# Patient Record
Sex: Female | Born: 1960 | Race: White | Hispanic: No | Marital: Married | Smoking: Current every day smoker
Health system: Southern US, Community
[De-identification: ages and names within clinical notes are randomized; demographics above are authoritative.]

## PROBLEM LIST (undated history)

## (undated) DIAGNOSIS — K30 Functional dyspepsia: Secondary | ICD-10-CM

## (undated) DIAGNOSIS — Z87442 Personal history of urinary calculi: Secondary | ICD-10-CM

## (undated) DIAGNOSIS — E782 Mixed hyperlipidemia: Secondary | ICD-10-CM

## (undated) DIAGNOSIS — R7303 Prediabetes: Secondary | ICD-10-CM

## (undated) DIAGNOSIS — K5790 Diverticulosis of intestine, part unspecified, without perforation or abscess without bleeding: Secondary | ICD-10-CM

## (undated) DIAGNOSIS — N159 Renal tubulo-interstitial disease, unspecified: Secondary | ICD-10-CM

## (undated) DIAGNOSIS — D649 Anemia, unspecified: Secondary | ICD-10-CM

## (undated) DIAGNOSIS — Z86010 Personal history of colonic polyps: Secondary | ICD-10-CM

## (undated) DIAGNOSIS — L409 Psoriasis, unspecified: Secondary | ICD-10-CM

## (undated) HISTORY — PX: OTHER SURGICAL HISTORY: SHX169

## (undated) HISTORY — PX: COLONOSCOPY: SHX174

## (undated) HISTORY — PX: EXTRACORPOREAL SHOCK WAVE LITHOTRIPSY: SHX1557

## (undated) HISTORY — PX: SKIN GRAFT: SHX250

## (undated) HISTORY — PX: DENTAL SURGERY: SHX609

---

## 2018-09-08 ENCOUNTER — Encounter (HOSPITAL_COMMUNITY)
Admission: EM | Disposition: A | Discharge status: Home / Self Care | Payer: Self-pay | Source: Home / Self Care | Attending: Emergency Medicine

## 2018-09-08 ENCOUNTER — Observation Stay (HOSPITAL_COMMUNITY)
Admission: EM | Admit: 2018-09-08 | Discharge: 2018-09-09 | Disposition: A | Discharge status: Home / Self Care | Payer: BLUE CROSS/BLUE SHIELD | Attending: Urology | Admitting: Urology

## 2018-09-08 ENCOUNTER — Other Ambulatory Visit: Payer: Self-pay

## 2018-09-08 ENCOUNTER — Encounter (HOSPITAL_COMMUNITY): Payer: Self-pay

## 2018-09-08 ENCOUNTER — Emergency Department (HOSPITAL_COMMUNITY): Payer: BLUE CROSS/BLUE SHIELD

## 2018-09-08 ENCOUNTER — Emergency Department (HOSPITAL_COMMUNITY): Payer: BLUE CROSS/BLUE SHIELD | Admitting: Anesthesiology

## 2018-09-08 DIAGNOSIS — N200 Calculus of kidney: Secondary | ICD-10-CM

## 2018-09-08 DIAGNOSIS — N12 Tubulo-interstitial nephritis, not specified as acute or chronic: Secondary | ICD-10-CM | POA: Diagnosis present

## 2018-09-08 DIAGNOSIS — N136 Pyonephrosis: Secondary | ICD-10-CM | POA: Diagnosis not present

## 2018-09-08 DIAGNOSIS — N132 Hydronephrosis with renal and ureteral calculous obstruction: Secondary | ICD-10-CM | POA: Diagnosis present

## 2018-09-08 HISTORY — PX: CYSTOSCOPY WITH STENT PLACEMENT: SHX5790

## 2018-09-08 LAB — CBC
HEMATOCRIT: 37.9 % (ref 36.0–46.0)
Hemoglobin: 12.1 g/dL (ref 12.0–15.0)
MCH: 28.5 pg (ref 26.0–34.0)
MCHC: 31.9 g/dL (ref 30.0–36.0)
MCV: 89.4 fL (ref 80.0–100.0)
NRBC: 0 % (ref 0.0–0.2)
Platelets: 617 10*3/uL — ABNORMAL HIGH (ref 150–400)
RBC: 4.24 MIL/uL (ref 3.87–5.11)
RDW: 14.1 % (ref 11.5–15.5)
WBC: 21.2 10*3/uL — AB (ref 4.0–10.5)

## 2018-09-08 LAB — URINALYSIS, ROUTINE W REFLEX MICROSCOPIC
BILIRUBIN URINE: NEGATIVE
Glucose, UA: NEGATIVE mg/dL
Ketones, ur: NEGATIVE mg/dL
Nitrite: POSITIVE — AB
Protein, ur: NEGATIVE mg/dL
SPECIFIC GRAVITY, URINE: 1.01 (ref 1.005–1.030)
WBC, UA: >50 WBC/hpf — ABNORMAL HIGH (ref 0–5)
pH: 6 (ref 5.0–8.0)

## 2018-09-08 LAB — BASIC METABOLIC PANEL
ANION GAP: 10 (ref 5–15)
BUN: 17 mg/dL (ref 6–20)
CALCIUM: 9 mg/dL (ref 8.9–10.3)
CO2: 22 mmol/L (ref 22–32)
Chloride: 103 mmol/L (ref 98–111)
Creatinine, Ser: 1.09 mg/dL — ABNORMAL HIGH (ref 0.44–1.00)
GFR, EST NON AFRICAN AMERICAN: 56 mL/min — AB (ref >60)
Glucose, Bld: 128 mg/dL — ABNORMAL HIGH (ref 70–99)
POTASSIUM: 3.6 mmol/L (ref 3.5–5.1)
Sodium: 135 mmol/L (ref 135–145)

## 2018-09-08 SURGERY — CYSTOSCOPY, WITH STENT INSERTION
Anesthesia: General | Site: Ureter | Laterality: Right

## 2018-09-08 MED ORDER — ZOLPIDEM TARTRATE 5 MG PO TABS: 5.0000 mg | ORAL_TABLET | Freq: Every evening | ORAL | Status: DC | PRN

## 2018-09-08 MED ORDER — ACETAMINOPHEN 325 MG PO TABS
650.0000 mg | ORAL_TABLET | ORAL | Status: DC | PRN
  Administered 2018-09-09 (×2): 650 mg via ORAL
  Filled 2018-09-08 (×2): qty 2

## 2018-09-08 MED ORDER — LIDOCAINE 2% (20 MG/ML) 5 ML SYRINGE
INTRAMUSCULAR | Status: DC | PRN
  Administered 2018-09-08: 60 mg via INTRAVENOUS

## 2018-09-08 MED ORDER — LACTATED RINGERS IV SOLN
Freq: Once | INTRAVENOUS | Status: AC
  Administered 2018-09-08: 16:00:00 via INTRAVENOUS

## 2018-09-08 MED ORDER — KETOROLAC TROMETHAMINE 15 MG/ML IJ SOLN
15.0000 mg | Freq: Once | INTRAMUSCULAR | Status: AC
  Administered 2018-09-08: 15 mg via INTRAVENOUS
  Filled 2018-09-08: qty 1

## 2018-09-08 MED ORDER — KCL IN DEXTROSE-NACL 20-5-0.45 MEQ/L-%-% IV SOLN
INTRAVENOUS | Status: DC
  Administered 2018-09-08 – 2018-09-09 (×3): via INTRAVENOUS
  Filled 2018-09-08 (×4): qty 1000

## 2018-09-08 MED ORDER — MIDAZOLAM HCL 2 MG/2ML IJ SOLN
INTRAMUSCULAR | Status: AC
  Filled 2018-09-08: qty 2

## 2018-09-08 MED ORDER — MIDAZOLAM HCL 5 MG/5ML IJ SOLN
INTRAMUSCULAR | Status: DC | PRN
  Administered 2018-09-08: 2 mg via INTRAVENOUS

## 2018-09-08 MED ORDER — SODIUM CHLORIDE 0.9 % IV SOLN: INTRAVENOUS | Status: DC | PRN

## 2018-09-08 MED ORDER — FENTANYL CITRATE (PF) 100 MCG/2ML IJ SOLN
INTRAMUSCULAR | Status: AC
  Filled 2018-09-08: qty 2

## 2018-09-08 MED ORDER — LACTATED RINGERS IV SOLN
INTRAVENOUS | Status: DC | PRN
  Administered 2018-09-08: 17:00:00 via INTRAVENOUS

## 2018-09-08 MED ORDER — FENTANYL CITRATE (PF) 100 MCG/2ML IJ SOLN: 25.0000 ug | INTRAMUSCULAR | Status: DC | PRN

## 2018-09-08 MED ORDER — FENTANYL CITRATE (PF) 100 MCG/2ML IJ SOLN
INTRAMUSCULAR | Status: DC | PRN
  Administered 2018-09-08 (×2): 50 ug via INTRAVENOUS

## 2018-09-08 MED ORDER — DIPHENHYDRAMINE HCL 12.5 MG/5ML PO ELIX: 12.5000 mg | ORAL_SOLUTION | Freq: Four times a day (QID) | ORAL | Status: DC | PRN

## 2018-09-08 MED ORDER — PROPOFOL 10 MG/ML IV BOLUS
INTRAVENOUS | Status: AC
  Filled 2018-09-08: qty 20

## 2018-09-08 MED ORDER — SODIUM CHLORIDE 0.9 % IR SOLN
Status: DC | PRN
  Administered 2018-09-08: 3000 mL

## 2018-09-08 MED ORDER — SODIUM CHLORIDE 0.9 % IV SOLN
1.0000 g | INTRAVENOUS | Status: DC
  Administered 2018-09-09: 1 g via INTRAVENOUS
  Filled 2018-09-08: qty 1

## 2018-09-08 MED ORDER — ONDANSETRON HCL 4 MG/2ML IJ SOLN: 4.0000 mg | INTRAMUSCULAR | Status: DC | PRN

## 2018-09-08 MED ORDER — PROPOFOL 10 MG/ML IV BOLUS
INTRAVENOUS | Status: DC | PRN
  Administered 2018-09-08: 140 mg via INTRAVENOUS

## 2018-09-08 MED ORDER — HYDROCODONE-ACETAMINOPHEN 5-325 MG PO TABS: 1.0000 | ORAL_TABLET | ORAL | Status: DC | PRN

## 2018-09-08 MED ORDER — SODIUM CHLORIDE 0.9 % IV SOLN
2.0000 g | Freq: Once | INTRAVENOUS | Status: AC
  Administered 2018-09-08: 2 g via INTRAVENOUS
  Filled 2018-09-08: qty 20

## 2018-09-08 MED ORDER — ONDANSETRON HCL 4 MG/2ML IJ SOLN
4.0000 mg | Freq: Once | INTRAMUSCULAR | Status: AC
  Administered 2018-09-08: 4 mg via INTRAVENOUS
  Filled 2018-09-08: qty 2

## 2018-09-08 MED ORDER — ROSUVASTATIN CALCIUM 20 MG PO TABS
20.0000 mg | ORAL_TABLET | Freq: Every day | ORAL | Status: DC
  Administered 2018-09-08: 20 mg via ORAL
  Filled 2018-09-08: qty 1

## 2018-09-08 MED ORDER — DEXAMETHASONE SODIUM PHOSPHATE 10 MG/ML IJ SOLN
INTRAMUSCULAR | Status: DC | PRN
  Administered 2018-09-08: 5 mg via INTRAVENOUS

## 2018-09-08 MED ORDER — PROMETHAZINE HCL 25 MG/ML IJ SOLN: 6.2500 mg | INTRAMUSCULAR | Status: DC | PRN

## 2018-09-08 MED ORDER — ONDANSETRON HCL 4 MG/2ML IJ SOLN
INTRAMUSCULAR | Status: DC | PRN
  Administered 2018-09-08: 4 mg via INTRAVENOUS

## 2018-09-08 MED ORDER — DIPHENHYDRAMINE HCL 50 MG/ML IJ SOLN: 12.5000 mg | Freq: Four times a day (QID) | INTRAMUSCULAR | Status: DC | PRN

## 2018-09-08 MED ORDER — SODIUM CHLORIDE 0.9 % IV BOLUS
500.0000 mL | Freq: Once | INTRAVENOUS | Status: AC
  Administered 2018-09-08: 500 mL via INTRAVENOUS

## 2018-09-08 SURGICAL SUPPLY — 15 items
BAG URO CATCHER STRL LF (MISCELLANEOUS) ×3 IMPLANT
CATH INTERMIT  6FR 70CM (CATHETERS) ×3 IMPLANT
CLOTH BEACON ORANGE TIMEOUT ST (SAFETY) ×3 IMPLANT
CONT SPEC 4OZ CLIKSEAL STRL BL (MISCELLANEOUS) ×3 IMPLANT
COVER WAND RF STERILE (DRAPES) IMPLANT
GLOVE BIOGEL M STRL SZ7.5 (GLOVE) ×3 IMPLANT
GLOVE BIOGEL PI IND STRL 6.5 (GLOVE) ×2 IMPLANT
GLOVE BIOGEL PI INDICATOR 6.5 (GLOVE) ×4
GOWN STRL REUS W/TWL LRG LVL3 (GOWN DISPOSABLE) ×6 IMPLANT
GUIDEWIRE STR DUAL SENSOR (WIRE) ×3 IMPLANT
MANIFOLD NEPTUNE II (INSTRUMENTS) ×3 IMPLANT
PACK CYSTO (CUSTOM PROCEDURE TRAY) ×3 IMPLANT
STENT URET 6FRX24 CONTOUR (STENTS) ×3 IMPLANT
TUBING CONNECTING 10 (TUBING) ×2 IMPLANT
TUBING CONNECTING 10' (TUBING) ×1

## 2018-09-08 NOTE — Op Note (Signed)
Preoperative diagnosis:  1. Right ureteral calculi and pyelonephritis   Postoperative diagnosis:  1. Right ureteral calculi and pyelonephritis   Procedure:  1. Cystoscopy 2. Right ureteral stent placement (6 x 24 - no string)  Surgeon: Rolly Salter, Montez Hageman. M.D.  Anesthesia: General  Complications: None  Intraoperative findings: There was noted to be purulent material from the stent after placement.  A urine culture was sent from the OR.  EBL: Minimal  Specimens: None  Indication: Rita Stewart is a 57 y.o. patient with residual right ureteral calculi after outside ESWL with fever and evidence of right pyelonephritis. After reviewing the management options for treatment, he elected to proceed with the above surgical procedure(s). We have discussed the potential benefits and risks of the procedure, side effects of the proposed treatment, the likelihood of the patient achieving the goals of the procedure, and any potential problems that might occur during the procedure or recuperation. Informed consent has been obtained.  Description of procedure:  The patient was taken to the operating room and general anesthesia was induced.  The patient was placed in the dorsal lithotomy position, prepped and draped in the usual sterile fashion, and preoperative antibiotics were administered. A preoperative time-out was performed.   Cystourethroscopy was performed.  The patient's urethra was examined and was normal. The bladder was then systematically examined in its entirety. There was no evidence for any bladder tumors, stones, or other mucosal pathology.    A 0.38 sensor guidewire was then advanced up the right ureter into the renal pelvis under fluoroscopic guidance.  The wire was then backloaded through the cystoscope and a ureteral stent was advance over the wire using Seldinger technique.  The stent was positioned appropriately under fluoroscopic and cystoscopic guidance.  The wire was then  removed with an adequate stent curl noted in the renal pelvis as well as in the bladder.  Purulent material was seen draining from the stent.  A urine culture was obtained and sent to the lab.  The bladder was then emptied and the procedure ended.  The patient appeared to tolerate the procedure well and without complications.  The patient was able to be awakened and transferred to the recovery unit in satisfactory condition.    Moody Bruins MD

## 2018-09-08 NOTE — Anesthesia Preprocedure Evaluation (Signed)
Anesthesia Evaluation  Patient identified by MRN, date of birth, ID band Patient awake    Reviewed: Allergy & Precautions, NPO status , Patient's Chart, lab work & pertinent test results  Airway Mallampati: II  TM Distance: >3 FB Neck ROM: Full    Dental no notable dental hx.    Pulmonary neg pulmonary ROS,    Pulmonary exam normal breath sounds clear to auscultation       Cardiovascular negative cardio ROS Normal cardiovascular exam Rhythm:Regular Rate:Normal     Neuro/Psych negative neurological ROS  negative psych ROS   GI/Hepatic negative GI ROS, Neg liver ROS,   Endo/Other  negative endocrine ROS  Renal/GU negative Renal ROS  negative genitourinary   Musculoskeletal negative musculoskeletal ROS (+)   Abdominal   Peds negative pediatric ROS (+)  Hematology negative hematology ROS (+)   Anesthesia Other Findings   Reproductive/Obstetrics negative OB ROS                             Anesthesia Physical Anesthesia Plan  ASA: II  Anesthesia Plan: General   Post-op Pain Management:    Induction: Intravenous  PONV Risk Score and Plan: 3 and Ondansetron, Dexamethasone and Treatment may vary due to age or medical condition  Airway Management Planned: LMA  Additional Equipment:   Intra-op Plan:   Post-operative Plan: Extubation in OR  Informed Consent: I have reviewed the patients History and Physical, chart, labs and discussed the procedure including the risks, benefits and alternatives for the proposed anesthesia with the patient or authorized representative who has indicated his/her understanding and acceptance.     Dental advisory given  Plan Discussed with: CRNA and Surgeon  Anesthesia Plan Comments:         Anesthesia Quick Evaluation  

## 2018-09-08 NOTE — Discharge Instructions (Signed)

## 2018-09-08 NOTE — ED Notes (Signed)
Patient aware we need urine sample. Will call out when ready to void.  

## 2018-09-08 NOTE — Anesthesia Procedure Notes (Signed)
Procedure Name: LMA Insertion Performed by: Ericson Nafziger J, CRNA Pre-anesthesia Checklist: Patient identified, Emergency Drugs available, Suction available, Patient being monitored and Timeout performed Patient Re-evaluated:Patient Re-evaluated prior to induction Oxygen Delivery Method: Circle system utilized Preoxygenation: Pre-oxygenation with 100% oxygen Induction Type: IV induction Ventilation: Mask ventilation without difficulty LMA: LMA inserted LMA Size: 4.0 Number of attempts: 1 Placement Confirmation: positive ETCO2 and breath sounds checked- equal and bilateral Tube secured with: Tape Dental Injury: Teeth and Oropharynx as per pre-operative assessment        

## 2018-09-08 NOTE — ED Provider Notes (Addendum)
[facility]-EMERGENCY DEPT Provider Note   CSN: [id number] Arrival date & time: 09/08/18  4098     History   Chief Complaint Chief Complaint  Patient presents with  . Flank Pain    HPI Rita Stewart is a 57 y.o. female with medical history significant for right-sided nephrolithiasis status post stent placement and removal of stent, hypertension, psoriasis, hyperlipidemia, adjustment insomnia presenting for evaluation of an acute onset right flank pain.  She also reports of subjective fevers with range of 99.1 to 100.43F, chills, nausea but however denies dysuria, urinary frequency, urinary urgency or hematuria.  Per chart review, patient was noted to have a 1.9 cm right renal pelvic calculus with mild right hydronephrosis on CT abdomen on 07/15/2018.  She subsequently developed UTI and was placed on IV antibiotics as well as oral Keflex with last dose on 09/02/2018.  She had a right J stent placement for 2 weeks which was removed on 10/17.  Follow-up abdominal ultrasound revealed no evidence of renal calculus.   Past Medical History:  Diagnosis Date  . Renal disorder     There are no active problems to display for this patient.   Past Surgical History:  Procedure Laterality Date  . DENTAL SURGERY    . EXTRACORPOREAL SHOCK WAVE LITHOTRIPSY    . SKIN GRAFT    . Stent in kidney       OB History   None      Home Medications    Prior to Admission medications   Medication Sig Start Date End Date Taking? Authorizing Provider  Choline Fenofibrate (FENOFIBRIC ACID) 135 MG CPDR Take 135 mg by mouth at bedtime.  08/16/18  Yes [provider]  ibuprofen (ADVIL,MOTRIN) 200 MG tablet Take 400-600 mg by mouth every 6 (six) hours as needed for mild pain.   Yes [provider]  ketorolac (TORADOL) 10 MG tablet Take 10 mg by mouth every 6 (six) hours as needed for moderate pain.  08/27/18  Yes [provider]  ondansetron (ZOFRAN) 4 MG  tablet Take 4 mg by mouth every 8 (eight) hours as needed for nausea or vomiting.   Yes [provider]  rosuvastatin (CRESTOR) 20 MG tablet Take 20 mg by mouth at bedtime. 08/19/18  Yes [provider]  traMADol (ULTRAM) 50 MG tablet Take 50 mg by mouth every 6 (six) hours as needed for moderate pain.  07/16/18  Yes [provider]  oxybutynin (DITROPAN) 5 MG tablet Take 5 mg by mouth 3 (three) times daily as needed for bladder spasms.  08/27/18   [provider]    Family History No family history on file.  Social History Social History   Tobacco Use  . Smoking status: Not on file  Substance Use Topics  . Alcohol use: Not on file  . Drug use: Not on file     Allergies   Ciprofloxacin and Nitrofurantoin   Review of Systems Review of Systems  Constitutional: Positive for chills and fever.  Respiratory: Negative.   Cardiovascular: Negative.   Gastrointestinal: Positive for nausea. Negative for vomiting.  Genitourinary: Positive for flank pain. Negative for difficulty urinating, dysuria, frequency and urgency.  Skin: Negative.   Neurological: Negative.   Psychiatric/Behavioral: Negative.      Physical Exam Updated Vital Signs BP 135/84   Pulse (!) 106   Temp 98.4 F (36.9 C) (Oral)   Resp 15   Ht 5\' 7"  (1.702 m)   Wt 73.5 kg  SpO2 95%   BMI 25.37 kg/m   Physical Exam  Constitutional: She appears well-developed and well-nourished.  HENT:  Head: Normocephalic and atraumatic.  Cardiovascular: Tachycardia present.  Pulmonary/Chest: Effort normal and breath sounds normal.  Abdominal: Soft. Bowel sounds are normal. There is CVA tenderness (Mild ). There is no rigidity, no rebound, no guarding, no tenderness at McBurney's point and negative Murphy's sign.  Neurological: She is alert.  Skin: Skin is warm.  Psychiatric: She has a normal mood and affect. Her behavior is normal. Judgment and thought content normal.     ED Treatments  / Results  Labs (all labs ordered are listed, but only abnormal results are displayed) Labs Reviewed  URINALYSIS, ROUTINE W REFLEX MICROSCOPIC - Abnormal; Notable for the following components:      Result Value   Hgb urine dipstick MODERATE (*)    Nitrite POSITIVE (*)    Leukocytes, UA LARGE (*)    WBC, UA >50 (*)    Bacteria, UA MANY (*)    All other components within normal limits  CBC - Abnormal; Notable for the following components:   WBC 21.2 (*)    Platelets 617 (*)    All other components within normal limits  BASIC METABOLIC PANEL - Abnormal; Notable for the following components:   Glucose, Bld 128 (*)    Creatinine, Ser 1.09 (*)    GFR calc non Af Amer 56 (*)    All other components within normal limits  URINE CULTURE    EKG None  Radiology Ct Renal Stone Study  Result Date: 09/08/2018 CLINICAL DATA:  57 y/o F; right flank pain since yesterday. Right-sided likely lithotripsy weeks ago. EXAM: CT ABDOMEN AND PELVIS WITHOUT CONTRAST TECHNIQUE: Multidetector CT imaging of the abdomen and pelvis was performed following the standard protocol without IV contrast. COMPARISON:  None. FINDINGS: Lower chest: No acute abnormality. Hepatobiliary: No focal liver abnormality. Elongated right lobe of the liver may represent hepatomegaly or a Riedel's lobe. Cholelithiasis. No biliary ductal dilatation. Pancreas: Unremarkable. No pancreatic ductal dilatation or surrounding inflammatory changes. Spleen: Normal in size without focal abnormality. Adrenals/Urinary Tract: Normal adrenal glands. Normal left kidney and ureter. Normal bladder. Moderate proximal right hydronephrosis and perinephric stranding. Mixed attenuation debris including foci of calcification and air at the ureteropelvic junction. There are few additional punctate nonobstructing stones in the right kidney. Stomach/Bowel: Stomach is within normal limits. Appendix appears normal. No evidence of bowel wall thickening, distention, or  inflammatory changes. Vascular/Lymphatic: No significant vascular findings are present. No enlarged abdominal or pelvic lymph nodes. Reproductive: Uterus and bilateral adnexa are unremarkable. Other: No abdominal wall hernia or abnormality. No abdominopelvic ascites. Musculoskeletal: No acute or significant osseous findings. IMPRESSION: 1. Moderate proximal right hydronephrosis and perinephric stranding. Obstructing mixed attenuation debris including foci of calcification and air at the ureteropelvic junction. Calcifications likely represent fragmented stones. Foci of air may be related to recent instrumentation versus infection, clinical correlation recommended. 2. Cholelithiasis. 3. Punctate right kidney nonobstructive nephrolithiasis. Electronically Signed   By: Mitzi Hansen M.D.   On: 09/08/2018 14:39    Procedures Procedures (including critical care time)  Medications Ordered in ED Medications  cefTRIAXone (ROCEPHIN) 2 g in sodium chloride 0.9 % 100 mL IVPB (2 g Intravenous New Bag/Given 09/08/18 1521)  0.9 %  sodium chloride infusion (has no administration in time range)  ondansetron (ZOFRAN) injection 4 mg (4 mg Intravenous Given 09/08/18 1331)  ketorolac (TORADOL) 15 MG/ML injection 15 mg (15 mg Intravenous Given 09/08/18  1331)  sodium chloride 0.9 % bolus 500 mL (0 mLs Intravenous Stopped 09/08/18 1412)     Initial Impression / Assessment and Plan / ED Course  I have reviewed the triage vital signs and the nursing notes.  Pertinent labs & imaging results that were available during my care of the patient were reviewed by me and considered in my medical decision making (see chart for details).   57 year old woman with history of right-sided nephrolithiasis status post Right JJ stent placement on 07/21/18  and removal on 09/02/18, extracorporal lithotripsy, UTI status post IV antibiotics and oral Keflex presenting with acute onset right flank pain, subjective fevers measuring  99.1 to 100.41F, chills but however denies dysuria, urinary frequency, urinary urgency, hematuria or radiating pain.  On follow-up with urology, KUB showed no evidence of renal calculus.  On arrival, patient was afebrile, tachycardic to 136.  On physical exams, she appeared in distress secondary to right flank pain, she had mild CVA tenderness.  Lab values are as follows: CBC shows leukocytosis of 21, BMP reveals mildly elevated creatinine of 1.09, urinalysis revealed positive nitrites, leukocytes and WBC, urine culture pending, CT abdomen and pelvis revealed moderate proximal right hydronephrosis and perinephric stranding, Punctate right kidney nonobstructive nephrolithiasis.  She received IV fluids, Toradol, Zofran.  Was also started on ceftriaxone and maintenance IV fluids  Differential diagnosis includes infected renal calculi versus pyelonephritis.  Urology was consulted and will follow up with patient for stent placement.  Final Clinical Impressions(s) / ED Diagnoses   Final diagnoses:  Nephrolithiasis  Pyelonephritis  Hydronephrosis with urinary obstruction due to renal calculus    ED Discharge Orders    None       Yvette Rack, MD 09/08/18 1533    Yvette Rack, MD 09/08/18 1552    Tilden Fossa, MD 09/11/18 [phone]

## 2018-09-08 NOTE — Transfer of Care (Signed)
Immediate Anesthesia Transfer of Care Note  Patient: Rita Stewart  Procedure(s) Performed: CYSTOSCOPY WITH STENT PLACEMENT (Right Ureter)  Patient Location: PACU  Anesthesia Type:General  Level of Consciousness: awake, alert  and oriented  Airway & Oxygen Therapy: Patient Spontanous Breathing and Patient connected to face mask oxygen  Post-op Assessment: Report given to RN and Post -op Vital signs reviewed and stable  Post vital signs: Reviewed and stable  Last Vitals:  Vitals Value Taken Time  BP 139/92 09/08/2018  5:02 PM  Temp    Pulse 99 09/08/2018  5:04 PM  Resp 17 09/08/2018  5:04 PM  SpO2 100 % 09/08/2018  5:04 PM  Vitals shown include unvalidated device data.  Last Pain:  Vitals:   09/08/18 1404  TempSrc:   PainSc: 3          Complications: No apparent anesthesia complications

## 2018-09-08 NOTE — Anesthesia Postprocedure Evaluation (Signed)
Anesthesia Post Note  Patient: Rita Stewart  Procedure(s) Performed: CYSTOSCOPY WITH STENT PLACEMENT (Right Ureter)     Patient location during evaluation: PACU Anesthesia Type: General Level of consciousness: awake and alert Pain management: pain level controlled Vital Signs Assessment: post-procedure vital signs reviewed and stable Respiratory status: spontaneous breathing, nonlabored ventilation, respiratory function stable and patient connected to nasal cannula oxygen Cardiovascular status: blood pressure returned to baseline and stable Postop Assessment: no apparent nausea or vomiting Anesthetic complications: no    Last Vitals:  Vitals:   09/08/18 1701 09/08/18 1715  BP: (!) 139/92 127/81  Pulse: 100 95  Resp: 18 18  Temp: 36.9 C   SpO2: 100% 100%    Last Pain:  Vitals:   09/08/18 1715  TempSrc:   PainSc: 0-No pain                 Belen Pesch S

## 2018-09-08 NOTE — ED Triage Notes (Addendum)
Pt presents with c/o right flank pain that started yesterday. Pt has a hx of kidney stones with extensive hx of same. Many recent issues and ESWL.

## 2018-09-08 NOTE — Consult Note (Signed)
Urology Consult   Physician requesting consult: Dr. Madilyn Hook  Reason for consult: Right ureteral stone and pyelonephritis  History of Present Illness: Rita Stewart is a 57 y.o. female who has a recent history of a 1.9 cm right renal pelvis/UPJ stone.  She required ureteral stent placement in [facility] per Dr. Elana Alm in September for acute infection and obstruction and underwent definitive stone treatment with ESWL on 08/27/18.  Her ureteral stent was removed last Friday.  She developed worsening pain and low grade fever today causing her to present to the ED.  Her WBC is 21,000 and her UA appears infected.    She has no prior history of urolithiasis.  She does wish to transfer her care to [facility].  Past Medical History:  Diagnosis Date  . Renal disorder     Past Surgical History:  Procedure Laterality Date  . DENTAL SURGERY    . EXTRACORPOREAL SHOCK WAVE LITHOTRIPSY    . SKIN GRAFT    . Stent in kidney       Current Hospital Medications:  Home meds:  No current facility-administered medications on file prior to encounter.    Current Outpatient Medications on File Prior to Encounter  Medication Sig Dispense Refill  . Choline Fenofibrate (FENOFIBRIC ACID) 135 MG CPDR Take 135 mg by mouth at bedtime.   1  . ibuprofen (ADVIL,MOTRIN) 200 MG tablet Take 400-600 mg by mouth every 6 (six) hours as needed for mild pain.    Marland Kitchen ketorolac (TORADOL) 10 MG tablet Take 10 mg by mouth every 6 (six) hours as needed for moderate pain.   0  . ondansetron (ZOFRAN) 4 MG tablet Take 4 mg by mouth every 8 (eight) hours as needed for nausea or vomiting.    . rosuvastatin (CRESTOR) 20 MG tablet Take 20 mg by mouth at bedtime.  1  . traMADol (ULTRAM) 50 MG tablet Take 50 mg by mouth every 6 (six) hours as needed for moderate pain.   0  . oxybutynin (DITROPAN) 5 MG tablet Take 5 mg by mouth 3 (three) times daily as needed for bladder spasms.   1     Scheduled Meds: Continuous Infusions: .  sodium chloride     PRN Meds:.sodium chloride  Allergies:  Allergies  Allergen Reactions  . Ciprofloxacin Nausea Only and Other (See Comments)    Abdominal pain  . Nitrofurantoin Nausea Only and Other (See Comments)    Abdominal pain    No family history on file.  Social History:  has no tobacco, alcohol, and drug history on file.  ROS: A complete review of systems was performed.  All systems are negative except for pertinent findings as noted.  Physical Exam:  Vital signs in last 24 hours: Temp:  [98.4 F (36.9 C)] 98.4 F (36.9 C) (10/23 1012) Pulse Rate:  [104-136] 105 (10/23 1545) Resp:  [14-20] 14 (10/23 1545) BP: (124-153)/(74-98) 139/86 (10/23 1545) SpO2:  [93 %-96 %] 94 % (10/23 1545) Weight:  [73.5 kg] 73.5 kg (10/23 1011) Constitutional:  Alert and oriented, No acute distress Cardiovascular: Regular rate and rhythm, No JVD Respiratory: Normal respiratory effort, Lungs clear bilaterally GI: Abdomen is soft, NT GU: Mild right CVA tenderness Lymphatic: No lymphadenopathy Neurologic: Grossly intact, no focal deficits Psychiatric: Normal mood and affect  Laboratory Data:  Recent Labs    09/08/18 1325  WBC 21.2*  HGB 12.1  HCT 37.9  PLT 617*    Recent Labs    09/08/18 1325  NA 135  K  3.6  CL 103  GLUCOSE 128*  BUN 17  CALCIUM 9.0  CREATININE 1.09*     Results for orders placed or performed during the hospital encounter of 09/08/18 (from the past 24 hour(s))  Urinalysis, Routine w reflex microscopic- may I&O cath if menses     Status: Abnormal   Collection Time: 09/08/18  1:25 PM  Result Value Ref Range   Color, Urine YELLOW YELLOW   APPearance CLEAR CLEAR   Specific Gravity, Urine 1.010 1.005 - 1.030   pH 6.0 5.0 - 8.0   Glucose, UA NEGATIVE NEGATIVE mg/dL   Hgb urine dipstick MODERATE (A) NEGATIVE   Bilirubin Urine NEGATIVE NEGATIVE   Ketones, ur NEGATIVE NEGATIVE mg/dL   Protein, ur NEGATIVE NEGATIVE mg/dL   Nitrite POSITIVE (A)  NEGATIVE   Leukocytes, UA LARGE (A) NEGATIVE   RBC / HPF 0-5 0 - 5 RBC/hpf   WBC, UA >50 (H) 0 - 5 WBC/hpf   Bacteria, UA MANY (A) NONE SEEN   Squamous Epithelial / LPF 0-5 0 - 5   Mucus PRESENT   CBC     Status: Abnormal   Collection Time: 09/08/18  1:25 PM  Result Value Ref Range   WBC 21.2 (H) 4.0 - 10.5 K/uL   RBC 4.24 3.87 - 5.11 MIL/uL   Hemoglobin 12.1 12.0 - 15.0 g/dL   HCT 81.1 91.4 - 78.2 %   MCV 89.4 80.0 - 100.0 fL   MCH 28.5 26.0 - 34.0 pg   MCHC 31.9 30.0 - 36.0 g/dL   RDW 95.6 21.3 - 08.6 %   Platelets 617 (H) 150 - 400 K/uL   nRBC 0.0 0.0 - 0.2 %  Basic metabolic panel     Status: Abnormal   Collection Time: 09/08/18  1:25 PM  Result Value Ref Range   Sodium 135 135 - 145 mmol/L   Potassium 3.6 3.5 - 5.1 mmol/L   Chloride 103 98 - 111 mmol/L   CO2 22 22 - 32 mmol/L   Glucose, Bld 128 (H) 70 - 99 mg/dL   BUN 17 6 - 20 mg/dL   Creatinine, Ser 5.78 (H) 0.44 - 1.00 mg/dL   Calcium 9.0 8.9 - 46.9 mg/dL   GFR calc non Af Amer 56 (L) >60 mL/min   GFR calc Af Amer >60 >60 mL/min   Anion gap 10 5 - 15   No results found for this or any previous visit (from the past 240 hour(s)).  Renal Function: Recent Labs    09/08/18 1325  CREATININE 1.09*   Estimated Creatinine Clearance: 56.7 mL/min (A) (by C-G formula based on SCr of 1.09 mg/dL (H)).  Radiologic Imaging: Ct Renal Stone Study  Result Date: 09/08/2018 CLINICAL DATA:  57 y/o F; right flank pain since yesterday. Right-sided likely lithotripsy weeks ago. EXAM: CT ABDOMEN AND PELVIS WITHOUT CONTRAST TECHNIQUE: Multidetector CT imaging of the abdomen and pelvis was performed following the standard protocol without IV contrast. COMPARISON:  None. FINDINGS: Lower chest: No acute abnormality. Hepatobiliary: No focal liver abnormality. Elongated right lobe of the liver may represent hepatomegaly or a Riedel's lobe. Cholelithiasis. No biliary ductal dilatation. Pancreas: Unremarkable. No pancreatic ductal dilatation  or surrounding inflammatory changes. Spleen: Normal in size without focal abnormality. Adrenals/Urinary Tract: Normal adrenal glands. Normal left kidney and ureter. Normal bladder. Moderate proximal right hydronephrosis and perinephric stranding. Mixed attenuation debris including foci of calcification and air at the ureteropelvic junction. There are few additional punctate nonobstructing stones in the right kidney.  Stomach/Bowel: Stomach is within normal limits. Appendix appears normal. No evidence of bowel wall thickening, distention, or inflammatory changes. Vascular/Lymphatic: No significant vascular findings are present. No enlarged abdominal or pelvic lymph nodes. Reproductive: Uterus and bilateral adnexa are unremarkable. Other: No abdominal wall hernia or abnormality. No abdominopelvic ascites. Musculoskeletal: No acute or significant osseous findings. IMPRESSION: 1. Moderate proximal right hydronephrosis and perinephric stranding. Obstructing mixed attenuation debris including foci of calcification and air at the ureteropelvic junction. Calcifications likely represent fragmented stones. Foci of air may be related to recent instrumentation versus infection, clinical correlation recommended. 2. Cholelithiasis. 3. Punctate right kidney nonobstructive nephrolithiasis. Electronically Signed   By: Mitzi Hansen M.D.   On: 09/08/2018 14:39    I independently reviewed the above imaging studies.  Impression/Recommendation: Right ureteral calculi and infection:  Urine has been culture and she has started IV ceftriaxone.  I recommended she proceed to the OR urgently for cystoscopy and right ureteral stent placement. I discussed the potential benefits and risks of the procedure, side effects of the proposed treatment, the likelihood of the patient achieving the goals of the procedure, and any potential problems that might occur during the procedure or recuperation.  She gives informed consent.  She  will be admitted postoperatively for observation and IV antibiotics and IV fluids.  Moris Ratchford,LES 09/08/2018, 3:57 PM  Moody Bruins. MD   CC: Dr. Madilyn Hook Dr. Tracey Harries

## 2018-09-09 ENCOUNTER — Encounter (HOSPITAL_COMMUNITY): Payer: Self-pay | Admitting: Urology

## 2018-09-09 LAB — BASIC METABOLIC PANEL
ANION GAP: 8 (ref 5–15)
BUN: 17 mg/dL (ref 6–20)
CALCIUM: 8.7 mg/dL — AB (ref 8.9–10.3)
CO2: 24 mmol/L (ref 22–32)
CREATININE: 0.94 mg/dL (ref 0.44–1.00)
Chloride: 107 mmol/L (ref 98–111)
Glucose, Bld: 139 mg/dL — ABNORMAL HIGH (ref 70–99)
Potassium: 4.3 mmol/L (ref 3.5–5.1)
SODIUM: 139 mmol/L (ref 135–145)

## 2018-09-09 LAB — CBC
HCT: 34.7 % — ABNORMAL LOW (ref 36.0–46.0)
Hemoglobin: 10.8 g/dL — ABNORMAL LOW (ref 12.0–15.0)
MCH: 28.4 pg (ref 26.0–34.0)
MCHC: 31.1 g/dL (ref 30.0–36.0)
MCV: 91.3 fL (ref 80.0–100.0)
NRBC: 0 % (ref 0.0–0.2)
PLATELETS: 577 10*3/uL — AB (ref 150–400)
RBC: 3.8 MIL/uL — AB (ref 3.87–5.11)
RDW: 14.1 % (ref 11.5–15.5)
WBC: 20.4 10*3/uL — ABNORMAL HIGH (ref 4.0–10.5)

## 2018-09-09 MED ORDER — CEFDINIR 300 MG PO CAPS: 300.0000 mg | ORAL_CAPSULE | Freq: Two times a day (BID) | ORAL | 0 refills | Status: DC

## 2018-09-09 MED ORDER — TRAMADOL HCL 50 MG PO TABS: 50.0000 mg | ORAL_TABLET | Freq: Four times a day (QID) | ORAL | 0 refills | Status: AC | PRN

## 2018-09-09 MED ORDER — TRAMADOL HCL 50 MG PO TABS: 50.0000 mg | ORAL_TABLET | Freq: Four times a day (QID) | ORAL | Status: DC | PRN

## 2018-09-09 NOTE — Progress Notes (Signed)
Reviewed discharged instructions with patient and family. All questions answered. AVS given to [name] and significant other. Patient left floor with nurse in wheelchair. Patient discharged to home.

## 2018-09-09 NOTE — Discharge Summary (Signed)
  Date of admission: 09/08/2018  Date of discharge: 09/09/2018  Admission diagnosis: Right ureteral calculi, pyelonephritis  Discharge diagnosis: Right ureteral calculi, pyelonephritis  History and Physical: For full details, please see admission history and physical. Briefly, Rita Stewart is a 57 y.o. year old patient with a history of a 1.9 cm right renal stone s/p ESWL treatment at an outside institution.  She had her stent removed last week and presented to the ED with fever and worsening right sided pain.  CT imaging revealed ureteral obstruction due to remaining UPJ calculi with evidence of a right renal infection.   Hospital Course: She underwent right ureteral stent placement on 09/08/18.  She remained afebrile postoperatively and was clinically improved over the next 24 hours.  Her urine culture was growing E coli.  Based on her prior antibiotic therapy and her response to ceftriaxone, it was felt that she could be discharged home on Cefdinir with plans to follow up on her culture results tomorrow.  She did receive a dose of ceftriaxone this evening before discharge.  Laboratory values:  Recent Labs    09/08/18 1325 09/09/18 0521  HGB 12.1 10.8*  HCT 37.9 34.7*   Recent Labs    09/08/18 1325 09/09/18 0521  CREATININE 1.09* 0.94    Disposition: Home    Discharge medications:  Allergies as of 09/09/2018      Reactions   Ciprofloxacin Nausea Only, Other (See Comments)   Abdominal pain   Nitrofurantoin Nausea Only, Other (See Comments)   Abdominal pain      Medication List    STOP taking these medications   ketorolac 10 MG tablet Commonly known as:  TORADOL     TAKE these medications   cefdinir 300 MG capsule Commonly known as:  OMNICEF Take 1 capsule (300 mg total) by mouth 2 (two) times daily.   Fenofibric Acid 135 MG Cpdr Take 135 mg by mouth at bedtime.   ibuprofen 200 MG tablet Commonly known as:  ADVIL,MOTRIN Take 400-600 mg by mouth every 6 (six)  hours as needed for mild pain.   ondansetron 4 MG tablet Commonly known as:  ZOFRAN Take 4 mg by mouth every 8 (eight) hours as needed for nausea or vomiting.   oxybutynin 5 MG tablet Commonly known as:  DITROPAN Take 5 mg by mouth 3 (three) times daily as needed for bladder spasms.   rosuvastatin 20 MG tablet Commonly known as:  CRESTOR Take 20 mg by mouth at bedtime.   traMADol 50 MG tablet Commonly known as:  ULTRAM Take 50 mg by mouth every 6 (six) hours as needed for moderate pain. What changed:  Another medication with the same name was added. Make sure you understand how and when to take each.   traMADol 50 MG tablet Commonly known as:  ULTRAM Take 1-2 tablets (50-100 mg total) by mouth every 6 (six) hours as needed (pain). What changed:  You were already taking a medication with the same name, and this prescription was added. Make sure you understand how and when to take each.       Followup:  Follow-up Information    Heloise Purpura, MD Follow up.   Specialty:  Urology Why:  Will call to schedule Contact information: [address] [place] [address] [phone]

## 2018-09-09 NOTE — Progress Notes (Signed)
I have assumed care over this patient and agree with previous nurse's assessment. I will continue to monitor.

## 2018-09-09 NOTE — Progress Notes (Signed)
Patient ID: Rita Stewart, female   DOB: [birth year], 57 y.o.   MRN: [id number]  1 Day Post-Op Subjective: Pt has done well overnight since stent placed.  No fever.  Minimal right flank pain.  Tolerating po intake.  Objective: Vital signs in last 24 hours: Temp:  [98 F (36.7 C)-98.7 F (37.1 C)] 98 F (36.7 C) (10/24 0546) Pulse Rate:  [88-136] 110 (10/24 0546) Resp:  [14-20] 18 (10/24 0546) BP: (104-153)/(60-98) 104/60 (10/24 0546) SpO2:  [93 %-100 %] 94 % (10/24 0546) Weight:  [73.5 kg] 73.5 kg (10/23 1011)  Intake/Output from previous day: 10/23 0701 - 10/24 0700 In: 2000.8 [I.V.:1500.2; IV Piggyback:500.6] Out: 1500 [Urine:1500] Intake/Output this shift: Total I/O In: 1000.2 [I.V.:1000.2] Out: 1500 [Urine:1500]  Physical Exam:  General: Alert and oriented Abd: No CVAT  Lab Results: Recent Labs    09/08/18 1325 09/09/18 0521  HGB 12.1 10.8*  HCT 37.9 34.7*   CBC Latest Ref Rng & Units 09/09/2018 09/08/2018  WBC 4.0 - 10.5 K/uL 20.4(H) 21.2(H)  Hemoglobin 12.0 - 15.0 g/dL 10.8(L) 12.1  Hematocrit 36.0 - 46.0 % 34.7(L) 37.9  Platelets 150 - 400 K/uL 577(H) 617(H)     BMET Recent Labs    09/08/18 1325 09/09/18 0521  NA 135 139  K 3.6 4.3  CL 103 107  CO2 22 24  GLUCOSE 128* 139*  BUN 17 17  CREATININE 1.09* 0.94  CALCIUM 9.0 8.7*     Studies/Results:   Assessment/Plan: Right ureteral calculi with pyelonephritis: Continue IV ceftriaxone with cultures pending.  Will re-evaluate later today for possible discharge.     LOS: 0 days   Clerence Gubser,LES 09/09/2018, 6:49 AM

## 2018-09-10 LAB — URINE CULTURE: Culture: >=100000 — AB

## 2018-09-15 ENCOUNTER — Other Ambulatory Visit: Payer: Self-pay | Admitting: Urology

## 2018-09-21 ENCOUNTER — Encounter (HOSPITAL_COMMUNITY): Payer: Self-pay

## 2018-09-21 NOTE — Patient Instructions (Signed)
Your procedure is scheduled on: Monday, Nov. 11, 2019   Surgery Time:  5:00PM-6:00PM   Report to [facility] Main  Entrance    Report to admitting at 3:00 PM   Call this number if you have problems the morning of surgery [phone]   Do not eat food:After Midnight.   May have liquids until 11:00AM morning of surgery  CLEAR LIQUID DIET  Foods Allowed                                                                     Foods Excluded  Water, Black Coffee and tea, regular and decaf                             liquids that you cannot  Plain Jell-O in any flavor                                             see through such as: Fruit ices (not with fruit pulp)                                     milk, soups, orange juice  Iced Popsicles                                    All solid food Carbonated beverages, regular and diet                                    Cranberry, grape and apple juices Sports drinks like Gatorade Lightly seasoned clear broth or consume(fat free) Sugar, honey syrup  Sample Menu Breakfast                                Lunch                                     Supper Cranberry juice                    Beef broth                            Chicken broth Jell-O                                     Grape juice                           Apple juice Coffee or tea  Jell-O                                      Popsicle                                                Coffee or tea                        Coffee or tea   Brush your teeth the morning of surgery.   Do NOT smoke after Midnight   Take these medicines the morning of surgery with A SIP OF WATER: None                               You may not have any metal on your body including hair pins, jewelry, and body piercings             Do not wear make-up, lotions, powders, perfumes/cologne, or deodorant             Do not wear nail polish.  Do not shave  48 hours prior to surgery.                Do not bring valuables to the hospital. [facility] IS NOT             RESPONSIBLE   FOR VALUABLES.   Contacts, dentures or bridgework may not be worn into surgery.    Patients discharged the day of surgery will not be allowed to drive home.   Special Instructions: Bring a copy of your healthcare power of attorney and living will documents         the day of surgery if you haven't scanned them in before.              Please read over the following fact sheets you were given:  [facility] - Preparing for Surgery Before surgery, you can play an important role.  Because skin is not sterile, your skin needs to be as free of germs as possible.  You can reduce the number of germs on your skin by washing with CHG (chlorahexidine gluconate) soap before surgery.  CHG is an antiseptic cleaner which kills germs and bonds with the skin to continue killing germs even after washing. Please DO NOT use if you have an allergy to CHG or antibacterial soaps.  If your skin becomes reddened/irritated stop using the CHG and inform your nurse when you arrive at Short Stay. Do not shave (including legs and underarms) for at least 48 hours prior to the first CHG shower.  You may shave your face/neck.  Please follow these instructions carefully:  1.  Shower with CHG Soap the night before surgery and the  morning of surgery.  2.  If you choose to wash your hair, wash your hair first as usual with your normal  shampoo.  3.  After you shampoo, rinse your hair and body thoroughly to remove the shampoo.                             4.  Use CHG as you would any other liquid soap.  You can apply chg directly to the skin and wash.  Gently with a scrungie or clean washcloth.  5.  Apply the CHG Soap to your body ONLY FROM THE NECK DOWN.   Do   not use on face/ open                           Wound or open sores. Avoid contact with eyes, ears mouth and   genitals (private parts).                       Wash face,   Genitals (private parts) with your normal soap.             6.  Wash thoroughly, paying special attention to the area where your    surgery  will be performed.  7.  Thoroughly rinse your body with warm water from the neck down.  8.  DO NOT shower/wash with your normal soap after using and rinsing off the CHG Soap.                9.  Pat yourself dry with a clean towel.            10.  Wear clean pajamas.            11.  Place clean sheets on your bed the night of your first shower and do not  sleep with pets. Day of Surgery : Do not apply any lotions/deodorants the morning of surgery.  Please wear clean clothes to the hospital/surgery center.  FAILURE TO FOLLOW THESE INSTRUCTIONS MAY RESULT IN THE CANCELLATION OF YOUR SURGERY  PATIENT SIGNATURE_________________________________  NURSE SIGNATURE__________________________________  ________________________________________________________________________

## 2018-09-22 ENCOUNTER — Encounter (HOSPITAL_COMMUNITY): Payer: Self-pay

## 2018-09-22 ENCOUNTER — Other Ambulatory Visit: Payer: Self-pay

## 2018-09-22 ENCOUNTER — Encounter (HOSPITAL_COMMUNITY)
Admission: RE | Admit: 2018-09-22 | Discharge: 2018-09-22 | Disposition: A | Discharge status: Home / Self Care | Payer: BLUE CROSS/BLUE SHIELD | Source: Ambulatory Visit | Attending: Urology | Admitting: Urology

## 2018-09-22 DIAGNOSIS — N202 Calculus of kidney with calculus of ureter: Secondary | ICD-10-CM | POA: Diagnosis not present

## 2018-09-22 DIAGNOSIS — Z01818 Encounter for other preprocedural examination: Secondary | ICD-10-CM | POA: Diagnosis not present

## 2018-09-22 HISTORY — DX: Functional dyspepsia: K30

## 2018-09-22 HISTORY — DX: Diverticulosis of intestine, part unspecified, without perforation or abscess without bleeding: K57.90

## 2018-09-22 HISTORY — DX: Psoriasis, unspecified: L40.9

## 2018-09-22 HISTORY — DX: Mixed hyperlipidemia: E78.2

## 2018-09-22 HISTORY — DX: Prediabetes: R73.03

## 2018-09-22 HISTORY — DX: Anemia, unspecified: D64.9

## 2018-09-22 HISTORY — DX: Personal history of urinary calculi: Z87.442

## 2018-09-22 HISTORY — DX: Personal history of colonic polyps: Z86.010

## 2018-09-22 LAB — HEMOGLOBIN A1C
Hgb A1c MFr Bld: 5.7 % — ABNORMAL HIGH (ref 4.8–5.6)
MEAN PLASMA GLUCOSE: 116.89 mg/dL

## 2018-09-22 LAB — CBC
HCT: 40.8 % (ref 36.0–46.0)
HEMOGLOBIN: 12.7 g/dL (ref 12.0–15.0)
MCH: 28.2 pg (ref 26.0–34.0)
MCHC: 31.1 g/dL (ref 30.0–36.0)
MCV: 90.5 fL (ref 80.0–100.0)
Platelets: 527 10*3/uL — ABNORMAL HIGH (ref 150–400)
RBC: 4.51 MIL/uL (ref 3.87–5.11)
RDW: 14.7 % (ref 11.5–15.5)
WBC: 10.7 10*3/uL — AB (ref 4.0–10.5)
nRBC: 0 % (ref 0.0–0.2)

## 2018-09-22 LAB — BASIC METABOLIC PANEL
Anion gap: 7 (ref 5–15)
BUN: 15 mg/dL (ref 6–20)
CHLORIDE: 106 mmol/L (ref 98–111)
CO2: 24 mmol/L (ref 22–32)
Calcium: 9.2 mg/dL (ref 8.9–10.3)
Creatinine, Ser: 0.74 mg/dL (ref 0.44–1.00)
GFR calc Af Amer: >60 mL/min (ref >60)
GFR calc non Af Amer: >60 mL/min (ref >60)
GLUCOSE: 92 mg/dL (ref 70–99)
POTASSIUM: 4 mmol/L (ref 3.5–5.1)
SODIUM: 137 mmol/L (ref 135–145)

## 2018-09-22 NOTE — Pre-Procedure Instructions (Signed)
Hgb A1C and CBC results 09/22/2018 sent to Dr. Laverle Patter via epic.

## 2018-09-24 NOTE — H&P (Signed)
Office Visit Report     09/22/2018   --------------------------------------------------------------------------------   Rita Stewart  MRN: [id number]  PRIMARY CARE:  Aura Dials, MD  DOB: [birth year], 57 year old Female  REFERRING:    SSN:   PROVIDER:  Heloise Purpura, M.D.    LOCATION:  [facility] [phone]   --------------------------------------------------------------------------------   CC/HPI: Right renal and ureteral calculi   [name] is a 57 year old female who had previously undergone shockwave lithotripsy for a large renal pelvic stone at an outside institution. He subsequently had her stent removed in presented to the emergency department at [facility] with severe pain and fever. She underwent urgent ureteral stent placement and was placed on appropriate, culture sensitive antibiotic therapy. She has now been off antibiotic therapy for the past few days. She denies any fever, malaise, or dysuria. She does have some expected stent symptoms including some urgency and frequency as expected.     ALLERGIES: Ciprofloxacin - Nausea/ abdominal pain nitrofurantoin - Nausea/abdominal pain    MEDICATIONS: Cefdinir 300 mg capsule  Fenofibric Acid 135 mg capsule,delayed release  Rosuvastatin Calcium 20 mg tablet  Tylenol 325 mg capsule     GU PSH: Cystoscopy Insert Stent, Right - 09/08/2018      PSH Notes: Birthmark removed (1972)   NON-GU PSH: None   GU PMH: None     PMH Notes:   1) Urolithiasis: She presented to me in October 2019. She had been found to have a large 1.9 cm right renal pelvic stone and was treated in [place] with ESWL after stent placement. Following stent removal, she developed fever and pain and presented to the [facility] ED. She underwent stent placement.   Oct 2019: R ureteral stent   NON-GU PMH: Hypercholesterolemia    FAMILY HISTORY: Ankylosing Spondylitis - Cousin Kidney Stones - Father   SOCIAL HISTORY:  Marital Status: Married Preferred Language: English; Ethnicity: Not Hispanic Or Latino; Race: White Current Smoking Status: Patient smokes. Has smoked since 09/17/1998. Smokes 1 pack per day.   Tobacco Use Assessment Completed: Used Tobacco in last 30 days? Social Drinker.  Drinks 3 caffeinated drinks per day. Patient's occupation is/was Retired.    REVIEW OF SYSTEMS:    GU Review Female:   Patient reports frequent urination and burning /pain with urination. Patient denies hard to postpone urination, get up at night to urinate, leakage of urine, stream starts and stops, trouble starting your stream, have to strain to urinate, and currently pregnant.  Gastrointestinal (Lower):   Patient reports diarrhea. Patient denies constipation.  Gastrointestinal (Upper):   Patient reports nausea and vomiting.   Constitutional:   Patient reports fever and weight loss. Patient denies night sweats and fatigue.  Skin:   Patient denies skin rash/ lesion and itching.  Eyes:   Patient denies blurred vision and double vision.  Ears/ Nose/ Throat:   Patient denies sore throat and sinus problems.  Hematologic/Lymphatic:   Patient denies swollen glands and easy bruising.  Cardiovascular:   Patient denies leg swelling and chest pains.  Respiratory:   Patient denies cough and shortness of breath.  Endocrine:   Patient denies excessive thirst.  Musculoskeletal:   Patient denies back pain and joint pain.  Neurological:   Patient denies headaches and dizziness.  Psychologic:   Patient denies depression and anxiety.   VITAL SIGNS:      09/22/2018 08:33 AM  Weight 160 lb / 72.57 kg  Height 67 in / 170.18 cm  BP 126/74 mmHg  Pulse 86 /min  Temperature 97.2 F / 36.2 C  BMI 25.1 kg/m   MULTI-SYSTEM PHYSICAL EXAMINATION:    Constitutional: Well-nourished. No physical deformities. Normally developed. Good grooming.  Respiratory: No labored breathing, no use of accessory muscles. Clear bilaterally.   Cardiovascular: Normal temperature, normal extremity pulses, no swelling, no varicosities. Regular rate and rhythm.  Gastrointestinal: No CVA tenderness.     PAST DATA REVIEWED:  Source Of History:  Patient  Records Review:   Previous Patient Records  Urine Test Review:   Urinalysis   PROCEDURES:          Urinalysis w/Scope - 81001 Dipstick Dipstick Cont'd Micro  Color: Yellow Bilirubin: Neg WBC/hpf: 20 - 40/hpf  Appearance: Clear Ketones: Neg RBC/hpf: 0 - 2/hpf  Specific Gravity: 1.010 Blood: Trace Bacteria: Few (10-25/hpf)  pH: 6.0 Protein: Trace Cystals: NS (Not Seen)  Glucose: Neg Urobilinogen: 0.2 Casts: NS (Not Seen)    Nitrites: Neg Trichomonas: Not Present    Leukocyte Esterase: 2+ Mucous: Not Present      Epithelial Cells: NS (Not Seen)      Yeast: NS (Not Seen)      Sperm: Not Present    Notes:      ASSESSMENT:      ICD-10 Details  1 GU:   Ureteral calculus - N20.1    PLAN:           Orders Labs Urine Culture          Schedule Return Visit/Planned Activity: Keep Scheduled Appointment          Document Letter(s):  Created for Patient: Clinical Summary         Notes:   1. Right renal and ureteral calculi: Her urine will be cultured today and she will be treated with appropriate antibiotic therapy if necessary prior to her upcoming procedure. After reviewing options for treatment, she has elected to proceed with right ureteroscopic laser lithotripsy and stone removal. We have discussed this procedure in detail today including the potential risks, complications, and expected recovery process. All questions were answered to her stated satisfaction. This is scheduled for next week.   Cc: Dr. Tracey Harries        Next Appointment:      Next Appointment: 09/27/2018 05:00 PM    Appointment Type: Surgery     Location: [facility] [phone]    Provider: Heloise Purpura, M.D.    Reason for Visit: WL/OP CYSTO, RT URS, LL STENT      * Signed  by Heloise Purpura, M.D. on 09/22/18 at 6:58 PM (EST)*       APPENDED NOTES:  Urine culture was positive for pansensitive E coli. She will be started on Bactrim in preparation for her surgery on Monday. She has been called in a week's worth and will be able to continue this postoperatively.     * Signed by Heloise Purpura, M.D. on 09/24/18 at 3:37 PM (EST)*

## 2018-09-27 ENCOUNTER — Encounter (HOSPITAL_COMMUNITY)
Admission: RE | Disposition: A | Discharge status: Home / Self Care | Payer: Self-pay | Source: Ambulatory Visit | Attending: Urology

## 2018-09-27 ENCOUNTER — Ambulatory Visit (HOSPITAL_COMMUNITY): Payer: BLUE CROSS/BLUE SHIELD

## 2018-09-27 ENCOUNTER — Ambulatory Visit (HOSPITAL_COMMUNITY): Payer: BLUE CROSS/BLUE SHIELD | Admitting: Certified Registered Nurse Anesthetist

## 2018-09-27 ENCOUNTER — Other Ambulatory Visit: Payer: Self-pay

## 2018-09-27 ENCOUNTER — Encounter (HOSPITAL_COMMUNITY): Payer: Self-pay

## 2018-09-27 ENCOUNTER — Ambulatory Visit (HOSPITAL_COMMUNITY)
Admission: RE | Admit: 2018-09-27 | Discharge: 2018-09-27 | Disposition: A | Discharge status: Home / Self Care | Payer: BLUE CROSS/BLUE SHIELD | Source: Ambulatory Visit | Attending: Urology | Admitting: Urology

## 2018-09-27 DIAGNOSIS — Z79899 Other long term (current) drug therapy: Secondary | ICD-10-CM | POA: Insufficient documentation

## 2018-09-27 DIAGNOSIS — A498 Other bacterial infections of unspecified site: Secondary | ICD-10-CM | POA: Insufficient documentation

## 2018-09-27 DIAGNOSIS — F17210 Nicotine dependence, cigarettes, uncomplicated: Secondary | ICD-10-CM | POA: Insufficient documentation

## 2018-09-27 DIAGNOSIS — E7800 Pure hypercholesterolemia, unspecified: Secondary | ICD-10-CM | POA: Diagnosis not present

## 2018-09-27 DIAGNOSIS — N202 Calculus of kidney with calculus of ureter: Secondary | ICD-10-CM | POA: Diagnosis not present

## 2018-09-27 HISTORY — PX: CYSTOSCOPY/URETEROSCOPY/HOLMIUM LASER/STENT PLACEMENT: SHX6546

## 2018-09-27 SURGERY — CYSTOSCOPY/URETEROSCOPY/HOLMIUM LASER/STENT PLACEMENT
Anesthesia: General | Laterality: Right

## 2018-09-27 MED ORDER — ONDANSETRON HCL 4 MG/2ML IJ SOLN
INTRAMUSCULAR | Status: DC | PRN
  Administered 2018-09-27: 4 mg via INTRAVENOUS

## 2018-09-27 MED ORDER — PROPOFOL 10 MG/ML IV BOLUS
INTRAVENOUS | Status: AC
  Filled 2018-09-27: qty 20

## 2018-09-27 MED ORDER — MEPERIDINE HCL 50 MG/ML IJ SOLN: 6.2500 mg | INTRAMUSCULAR | Status: DC | PRN

## 2018-09-27 MED ORDER — SODIUM CHLORIDE 0.9 % IR SOLN
Status: DC | PRN
  Administered 2018-09-27: 6000 mL

## 2018-09-27 MED ORDER — SCOPOLAMINE 1 MG/3DAYS TD PT72
1.0000 | MEDICATED_PATCH | Freq: Once | TRANSDERMAL | Status: DC
  Administered 2018-09-27: 1.5 mg via TRANSDERMAL
  Filled 2018-09-27: qty 1

## 2018-09-27 MED ORDER — MIDAZOLAM HCL 2 MG/2ML IJ SOLN
INTRAMUSCULAR | Status: DC | PRN
  Administered 2018-09-27: 2 mg via INTRAVENOUS

## 2018-09-27 MED ORDER — HYDROMORPHONE HCL 1 MG/ML IJ SOLN: 0.2500 mg | INTRAMUSCULAR | Status: DC | PRN

## 2018-09-27 MED ORDER — SUCCINYLCHOLINE CHLORIDE 200 MG/10ML IV SOSY
PREFILLED_SYRINGE | INTRAVENOUS | Status: AC
  Filled 2018-09-27: qty 10

## 2018-09-27 MED ORDER — ROCURONIUM BROMIDE 10 MG/ML (PF) SYRINGE
PREFILLED_SYRINGE | INTRAVENOUS | Status: AC
  Filled 2018-09-27: qty 10

## 2018-09-27 MED ORDER — DEXAMETHASONE SODIUM PHOSPHATE 10 MG/ML IJ SOLN
INTRAMUSCULAR | Status: DC | PRN
  Administered 2018-09-27: 10 mg via INTRAVENOUS

## 2018-09-27 MED ORDER — LACTATED RINGERS IV SOLN
INTRAVENOUS | Status: DC
  Administered 2018-09-27: 16:00:00 via INTRAVENOUS

## 2018-09-27 MED ORDER — FENTANYL CITRATE (PF) 100 MCG/2ML IJ SOLN
INTRAMUSCULAR | Status: AC
  Filled 2018-09-27: qty 2

## 2018-09-27 MED ORDER — FENTANYL CITRATE (PF) 100 MCG/2ML IJ SOLN
INTRAMUSCULAR | Status: DC | PRN
  Administered 2018-09-27 (×4): 50 ug via INTRAVENOUS

## 2018-09-27 MED ORDER — MIDAZOLAM HCL 2 MG/2ML IJ SOLN: 0.5000 mg | Freq: Once | INTRAMUSCULAR | Status: DC | PRN

## 2018-09-27 MED ORDER — PROPOFOL 10 MG/ML IV BOLUS
INTRAVENOUS | Status: DC | PRN
  Administered 2018-09-27: 150 mg via INTRAVENOUS

## 2018-09-27 MED ORDER — DEXAMETHASONE SODIUM PHOSPHATE 10 MG/ML IJ SOLN
INTRAMUSCULAR | Status: AC
  Filled 2018-09-27: qty 2

## 2018-09-27 MED ORDER — SUGAMMADEX SODIUM 500 MG/5ML IV SOLN
INTRAVENOUS | Status: AC
  Filled 2018-09-27: qty 5

## 2018-09-27 MED ORDER — PHENYLEPHRINE 40 MCG/ML (10ML) SYRINGE FOR IV PUSH (FOR BLOOD PRESSURE SUPPORT)
PREFILLED_SYRINGE | INTRAVENOUS | Status: DC | PRN
  Administered 2018-09-27: 160 ug via INTRAVENOUS
  Administered 2018-09-27: 120 ug via INTRAVENOUS
  Administered 2018-09-27: 80 ug via INTRAVENOUS

## 2018-09-27 MED ORDER — PROMETHAZINE HCL 25 MG/ML IJ SOLN: 6.2500 mg | INTRAMUSCULAR | Status: DC | PRN

## 2018-09-27 MED ORDER — LIDOCAINE 2% (20 MG/ML) 5 ML SYRINGE
INTRAMUSCULAR | Status: AC
  Filled 2018-09-27: qty 20

## 2018-09-27 MED ORDER — MIDAZOLAM HCL 2 MG/2ML IJ SOLN
INTRAMUSCULAR | Status: AC
  Filled 2018-09-27: qty 2

## 2018-09-27 MED ORDER — LIDOCAINE 2% (20 MG/ML) 5 ML SYRINGE
INTRAMUSCULAR | Status: DC | PRN
  Administered 2018-09-27: 40 mg via INTRAVENOUS

## 2018-09-27 MED ORDER — SODIUM CHLORIDE 0.9 % IV SOLN
2.0000 g | Freq: Once | INTRAVENOUS | Status: AC
  Administered 2018-09-27: 2 g via INTRAVENOUS
  Filled 2018-09-27: qty 20

## 2018-09-27 MED ORDER — PHENYLEPHRINE 40 MCG/ML (10ML) SYRINGE FOR IV PUSH (FOR BLOOD PRESSURE SUPPORT)
PREFILLED_SYRINGE | INTRAVENOUS | Status: AC
  Filled 2018-09-27: qty 20

## 2018-09-27 MED ORDER — ONDANSETRON HCL 4 MG/2ML IJ SOLN
INTRAMUSCULAR | Status: AC
  Filled 2018-09-27: qty 4

## 2018-09-27 SURGICAL SUPPLY — 23 items
BAG URO CATCHER STRL LF (MISCELLANEOUS) ×3 IMPLANT
BASKET STNLS GEMINI 4WIRE 3FR (BASKET) ×3 IMPLANT
BASKET ZERO TIP NITINOL 2.4FR (BASKET) ×3 IMPLANT
CATH INTERMIT  6FR 70CM (CATHETERS) IMPLANT
CLOTH BEACON ORANGE TIMEOUT ST (SAFETY) ×3 IMPLANT
COVER WAND RF STERILE (DRAPES) IMPLANT
FIBER LASER FLEXIVA 365 (UROLOGICAL SUPPLIES) IMPLANT
FIBER LASER TRAC TIP (UROLOGICAL SUPPLIES) IMPLANT
GAUZE SPONGE 4X4 16PLY XRAY LF (GAUZE/BANDAGES/DRESSINGS) ×3 IMPLANT
GLOVE BIOGEL M STRL SZ7.5 (GLOVE) ×3 IMPLANT
GOWN STRL REUS W/TWL LRG LVL3 (GOWN DISPOSABLE) ×6 IMPLANT
GUIDEWIRE ANG ZIPWIRE 038X150 (WIRE) IMPLANT
GUIDEWIRE STR DUAL SENSOR (WIRE) ×3 IMPLANT
IV NS 1000ML (IV SOLUTION)
IV NS 1000ML BAXH (IV SOLUTION) IMPLANT
MANIFOLD NEPTUNE II (INSTRUMENTS) ×3 IMPLANT
PACK CYSTO (CUSTOM PROCEDURE TRAY) ×3 IMPLANT
SHEATH URETERAL 12FRX35CM (MISCELLANEOUS) ×3 IMPLANT
STENT URET 6FRX24 CONTOUR (STENTS) ×3 IMPLANT
SYRINGE 20CC LL (MISCELLANEOUS) ×3 IMPLANT
TUBING CONNECTING 10 (TUBING) ×2 IMPLANT
TUBING CONNECTING 10' (TUBING) ×1
TUBING UROLOGY SET (TUBING) ×3 IMPLANT

## 2018-09-27 NOTE — Op Note (Signed)
Preoperative diagnosis: Right renal/ureteral calculi  Postoperative diagnosis: Right renal/ureteral calculi  Procedure:  1. Cystoscopy 2. Right ureteroscopy and stone removal 3. Right ureteral stent placement (6 x 24 with string)  Surgeon: Rolly Salter, Montez Hageman. M.D.  Anesthesia: General  Complications: None  EBL: Minimal  Indication: Rita Stewart  is a 57 y.o. patient with urolithiasis. She underwent right ESWL for a large right renal pelvic stone at an outside institution.  She developed residual obstruction due to remaining debris/stone material after stent removal and presented to me with fever and right ureteral obstruction and underwent urgent ureteral stenting. She presents today for definitive treatment of any remaining stone fragments.  After reviewing the management options for treatment, they elected to proceed with the above surgical procedure(s). We have discussed the potential benefits and risks of the procedure, side effects of the proposed treatment, the likelihood of the patient achieving the goals of the procedure, and any potential problems that might occur during the procedure or recuperation. Informed consent has been obtained.  Description of procedure:  The patient was taken to the operating room and general anesthesia was induced.  The patient was placed in the dorsal lithotomy position, prepped and draped in the usual sterile fashion, and preoperative antibiotics were administered. A preoperative time-out was performed.   Cystourethroscopy was performed.  The patient's urethra was examined and was normal. The bladder was then systematically examined in its entirety. There was no evidence for any bladder tumors, stones, or other mucosal pathology.    Attention then turned to the right ureteral orifice and the indwelling stent was identified and removed to the urethral meatus.  A 0.38 sensor guidewire was then advanced up the right ureter into the renal  pelvis under fluoroscopic guidance.  Semirigid ureteroscopy was first performed and the entire ureter up to the level of the UPJ was evaluated and was free of any stones or other abnormalities. A 12/14 Fr ureteral access sheath was then advanced over the guide wire. The digital flexible ureteroscope was then advanced through the access sheath into the ureter next to the guidewire and the renal pelvis was examined.  No well defined ureteral calculi were remaining but a large amount of debris and stone material was seen.  Using first a zero tip basket and then a [employer], as much of the debris/stone material was removed as possible.  However, this material was quite soft.  I then used a 20 cc syringe and irrigated the renal collecting system and was able to remove the remaining large areas of debris in this fashion.  Once most all of the debris was removed, the renal collecting system was again evaluated and no stones or sizeable areas of debris remained.  The safety wire was then replaced and the access sheath removed.  The guidewire was backloaded through the cystoscope and a ureteral stent was advance over the wire using Seldinger technique.  The stent was positioned appropriately under fluoroscopic and cystoscopic guidance.  The wire was then removed with an adequate stent curl noted in the renal pelvis as well as in the bladder.  The bladder was then emptied and the procedure ended.  The patient appeared to tolerate the procedure well and without complications.  The patient was able to be awakened and transferred to the recovery unit in satisfactory condition.   Moody Bruins MD

## 2018-09-27 NOTE — Discharge Instructions (Signed)
1. You may see some blood in the urine and may have some burning with urination for 48-72 hours. You also may notice that you have to urinate more frequently or urgently after your procedure which is normal.  °2. You should call should you develop an inability urinate, fever > 101, persistent nausea and vomiting that prevents you from eating or drinking to stay hydrated.  °3. If you have a stent, you will likely urinate more frequently and urgently until the stent is removed and you may experience some discomfort/pain in the lower abdomen and flank especially when urinating. You may take pain medication prescribed to you if needed for pain. You may also intermittently have blood in the urine until the stent is removed. °4.   You may remove your stent on Friday morning.  Simply pull the string that is taped to your body and the stent will easily come out.  This may be best done in the shower as some urine may come out with the stent.  Usually you will feel relief once the stent is removed, but occasionally patients can develop pain due to residual swelling of the ureter that may temporarily obstruct the kidney.  This can be managed by taking pain medication and it will typically resolve with time.  Please do not hesitate to call if you have pain that is not controlled with your pain medication or does not improved within 24-48 hours. ° °

## 2018-09-27 NOTE — Anesthesia Procedure Notes (Signed)
Procedure Name: LMA Insertion Date/Time: 09/27/2018 5:17 PM Performed by: Minerva Ends, CRNA Pre-anesthesia Checklist: Patient identified, Emergency Drugs available, Suction available and Patient being monitored Patient Re-evaluated:Patient Re-evaluated prior to induction Oxygen Delivery Method: Circle System Utilized Preoxygenation: Pre-oxygenation with 100% oxygen Induction Type: IV induction Ventilation: Mask ventilation without difficulty LMA: LMA inserted LMA Size: 4.0 Number of attempts: 1 Placement Confirmation: positive ETCO2 Tube secured with: Tape Dental Injury: Teeth and Oropharynx as per pre-operative assessment  Comments: Smooth IV induction Jean Rosenthal-- LMA insertion AM CRNA atraumatic--- teeth and mouth as preop-- bilat BS Jean Rosenthal---

## 2018-09-27 NOTE — Anesthesia Preprocedure Evaluation (Addendum)
Anesthesia Evaluation  Patient identified by MRN, date of birth, ID band Patient awake    Reviewed: Allergy & Precautions, NPO status , Patient's Chart, lab work & pertinent test results  History of Anesthesia Complications Negative for: history of anesthetic complications  Airway Mallampati: II  TM Distance: >3 FB Neck ROM: Full    Dental  (+) Dental Advisory Given   Pulmonary Current Smoker,    breath sounds clear to auscultation       Cardiovascular negative cardio ROS   Rhythm:Regular Rate:Normal     Neuro/Psych negative neurological ROS     GI/Hepatic negative GI ROS, Neg liver ROS,   Endo/Other  negative endocrine ROS  Renal/GU negative Renal ROSstones     Musculoskeletal   Abdominal   Peds  Hematology negative hematology ROS (+)   Anesthesia Other Findings   Reproductive/Obstetrics                            Anesthesia Physical Anesthesia Plan  ASA: II  Anesthesia Plan: General   Post-op Pain Management:    Induction: Intravenous  PONV Risk Score and Plan: 2 and Dexamethasone and Ondansetron  Airway Management Planned: LMA  Additional Equipment:   Intra-op Plan:   Post-operative Plan:   Informed Consent: I have reviewed the patients History and Physical, chart, labs and discussed the procedure including the risks, benefits and alternatives for the proposed anesthesia with the patient or authorized representative who has indicated his/her understanding and acceptance.   Dental advisory given  Plan Discussed with: CRNA and Surgeon  Anesthesia Plan Comments: (Plan routine monitors, GA- LMA OK)        Anesthesia Quick Evaluation

## 2018-09-27 NOTE — Transfer of Care (Signed)
Immediate Anesthesia Transfer of Care Note  Patient: Rita Stewart  Procedure(s) Performed: CYSTOSCOPY/URETEROSCOPY/STONE BASKETRY/STENT PLACEMENT (Right )  Patient Location: PACU  Anesthesia Type:General  Level of Consciousness: awake and alert   Airway & Oxygen Therapy: Patient Spontanous Breathing and Patient connected to face mask oxygen  Post-op Assessment: Report given to RN and Post -op Vital signs reviewed and stable  Post vital signs: Reviewed and stable  Last Vitals:  Vitals Value Taken Time  BP    Temp    Pulse 77 09/27/2018  6:23 PM  Resp 11 09/27/2018  6:23 PM  SpO2 100 % 09/27/2018  6:23 PM  Vitals shown include unvalidated device data.  Last Pain:  Vitals:   09/27/18 1532  TempSrc:   PainSc: 1       Patients Stated Pain Goal: 4 (09/27/18 1532)  Complications: No apparent anesthesia complications

## 2018-09-27 NOTE — Interval H&P Note (Signed)
History and Physical Interval Note:  09/27/2018 4:32 PM  Rita Stewart  has presented today for surgery, with the diagnosis of RIGHT RENAL STONE, RIGHT URETERAL STONE  The various methods of treatment have been discussed with the patient and family. After consideration of risks, benefits and other options for treatment, the patient has consented to  Procedure(s): CYSTOSCOPY/URETEROSCOPY/HOLMIUM LASER/STENT PLACEMENT (Right) as a surgical intervention .  The patient's history has been reviewed, patient examined, no change in status, stable for surgery.  I have reviewed the patient's chart and labs.  Questions were answered to the patient's satisfaction.     Rita Stewart,LES

## 2018-09-27 NOTE — Anesthesia Postprocedure Evaluation (Signed)
Anesthesia Post Note  Patient: Rita Stewart  Procedure(s) Performed: CYSTOSCOPY/URETEROSCOPY/STONE BASKETRY/STENT PLACEMENT (Right )     Patient location during evaluation: PACU Anesthesia Type: General Level of consciousness: awake and alert, oriented and patient cooperative Pain management: pain level controlled Vital Signs Assessment: post-procedure vital signs reviewed and stable Respiratory status: spontaneous breathing, nonlabored ventilation and respiratory function stable Cardiovascular status: blood pressure returned to baseline and stable Postop Assessment: no apparent nausea or vomiting Anesthetic complications: no    Last Vitals:  Vitals:   09/27/18 1830 09/27/18 1840  BP: 127/77   Pulse: 81   Resp: 15   Temp:    SpO2: 100% 95%    Last Pain:  Vitals:   09/27/18 1840  TempSrc:   PainSc: 0-No pain                 [name],E. Smokey Melott

## 2018-09-27 NOTE — Anesthesia Procedure Notes (Signed)
Date/Time: 09/27/2018 6:14 PM Performed by: Minerva Ends, CRNA Oxygen Delivery Method: Simple face mask Placement Confirmation: positive ETCO2 and breath sounds checked- equal and bilateral Dental Injury: Teeth and Oropharynx as per pre-operative assessment

## 2018-09-28 ENCOUNTER — Encounter (HOSPITAL_COMMUNITY): Payer: Self-pay | Admitting: Urology

## 2019-10-16 IMAGING — CT CT RENAL STONE PROTOCOL
2 of 4 series · 16 of 46 positions shown, 18 images · non-contrast
Comparison: None.

CLINICAL DATA: 57 y/o F; right flank pain since yesterday.
Right-sided likely lithotripsy weeks ago.

EXAM:
CT ABDOMEN AND PELVIS WITHOUT CONTRAST
TECHNIQUE: Multidetector CT imaging of the abdomen and pelvis was performed
following the standard protocol without IV contrast.

[Series 2: axial st · axial · 0.95mm/px · z∈[+855,+1280]mm · 13 of 95 slices shown, 15 images]
[im 5/95  soft-tissue]
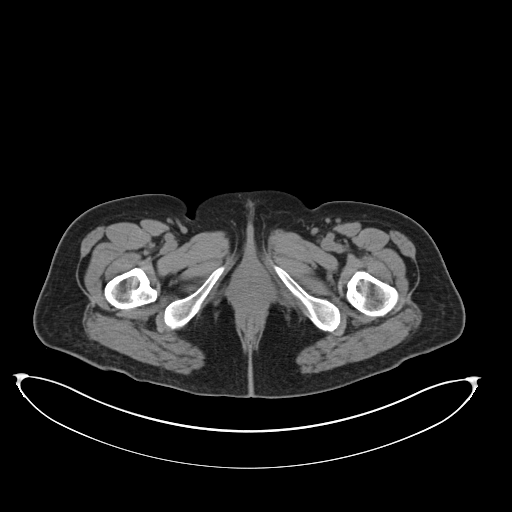
[im 5/95  bone]
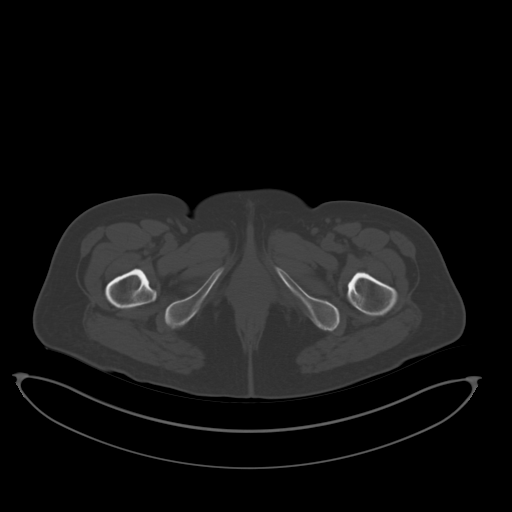
[im 15/95  soft-tissue]
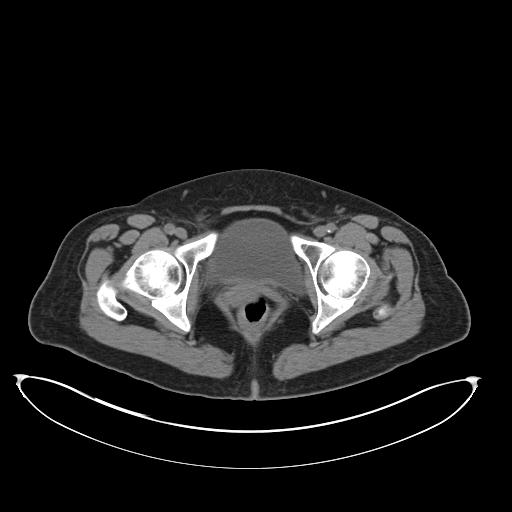
[im 20/95  soft-tissue]
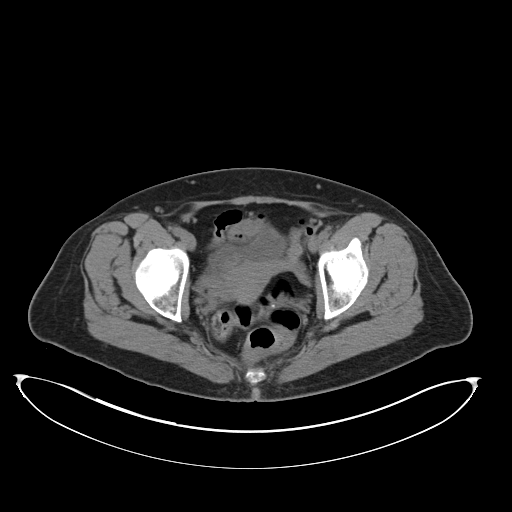
[im 25/95  soft-tissue]
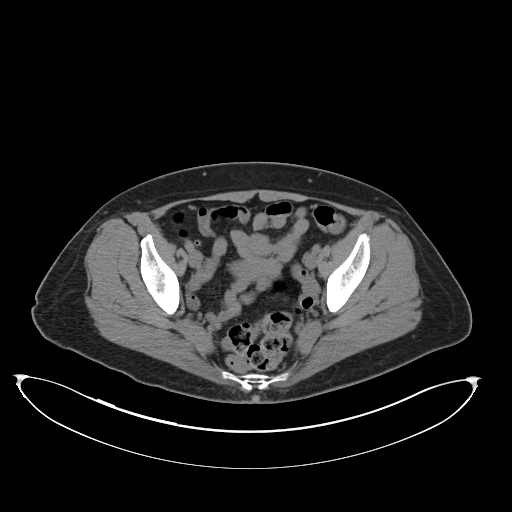
[im 35/95  soft-tissue]
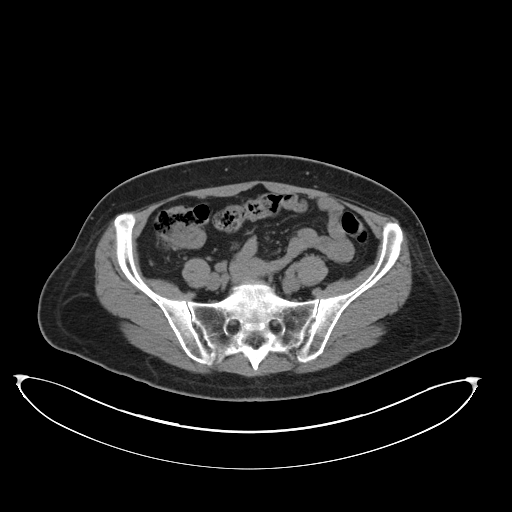
[im 40/95  soft-tissue]
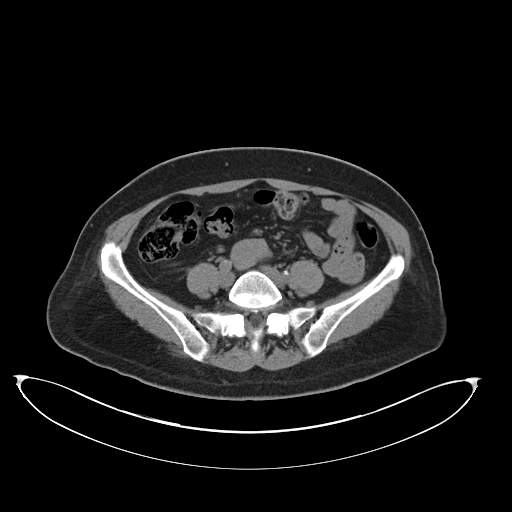
[im 50/95  soft-tissue]
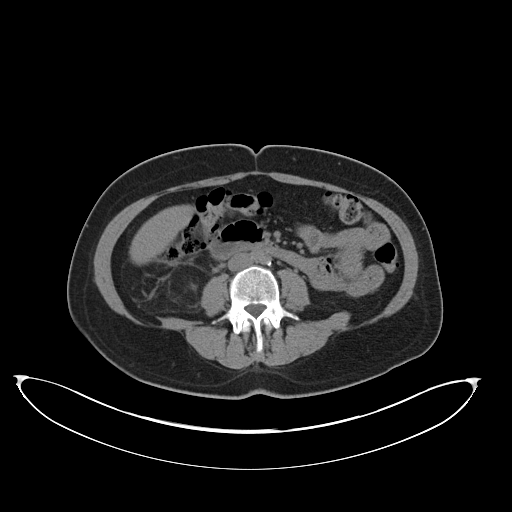
[im 55/95  soft-tissue]
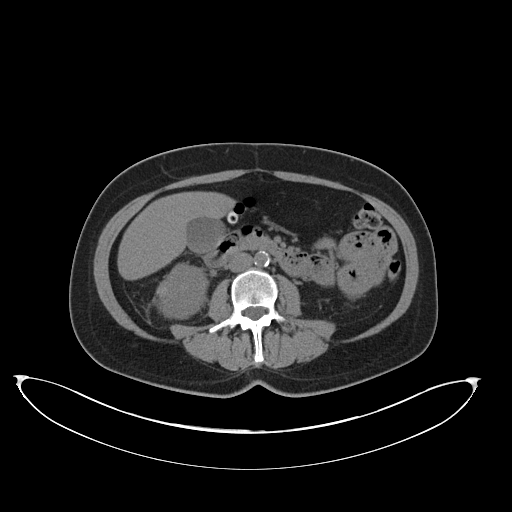
[im 60/95  soft-tissue]
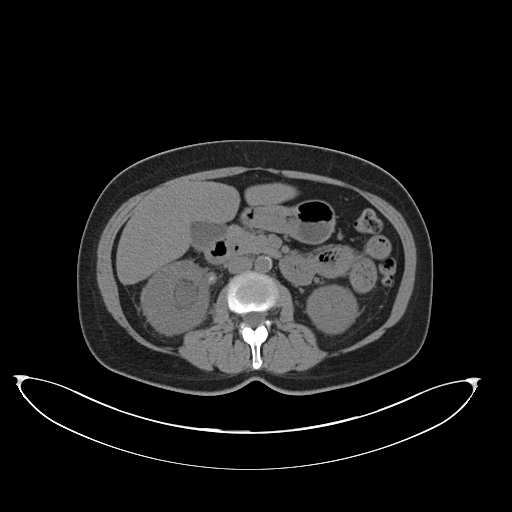
[im 60/95  bone]
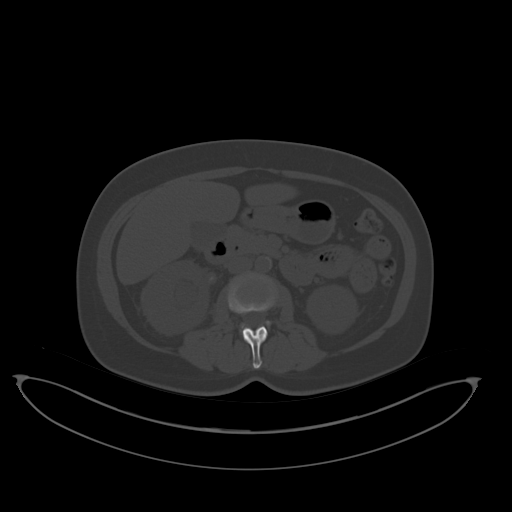
[im 70/95  soft-tissue]
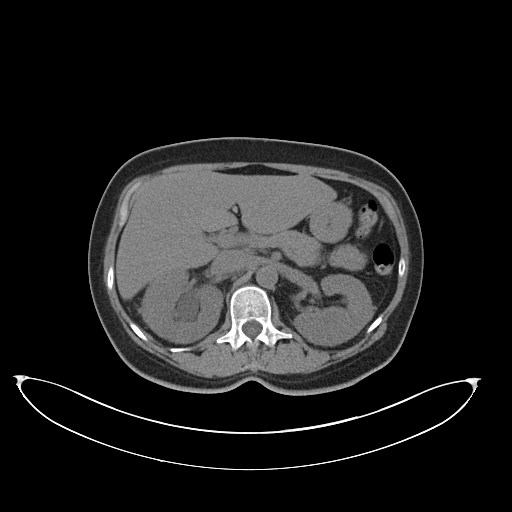
[im 75/95  soft-tissue]
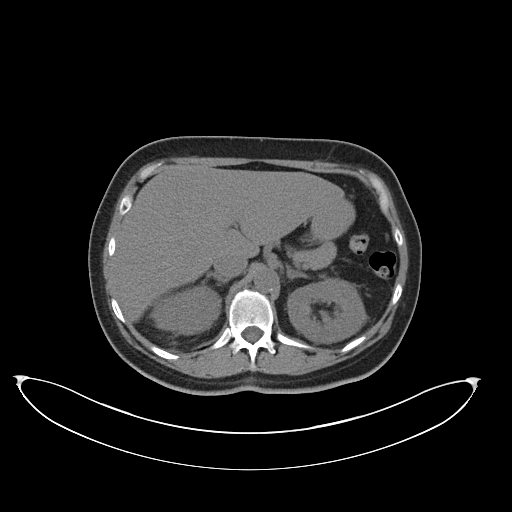
[im 80/95  soft-tissue]
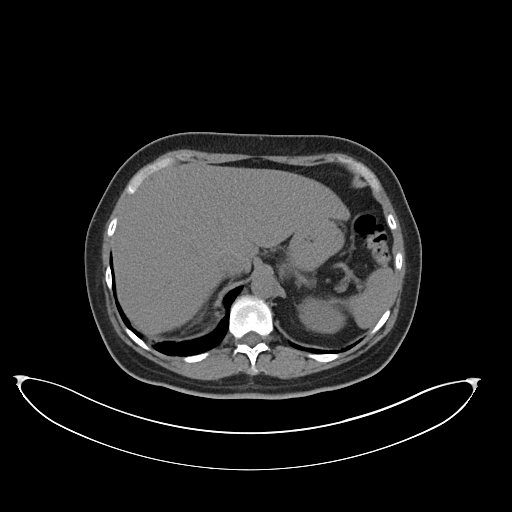
[im 90/95  soft-tissue]
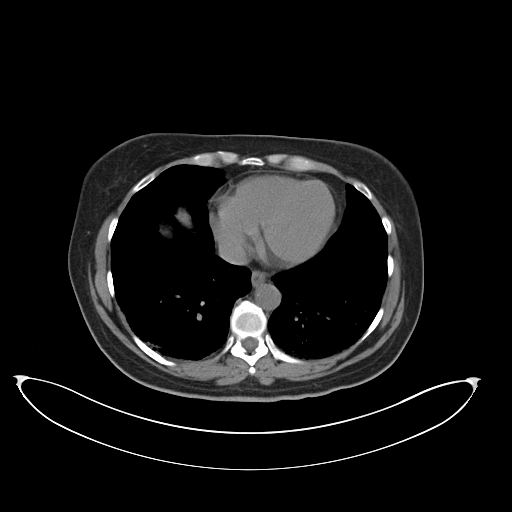

[Series 4: coronal · coronal · 0.71mm/px · 3 of 136 slices shown]
[im 46/136  soft-tissue]
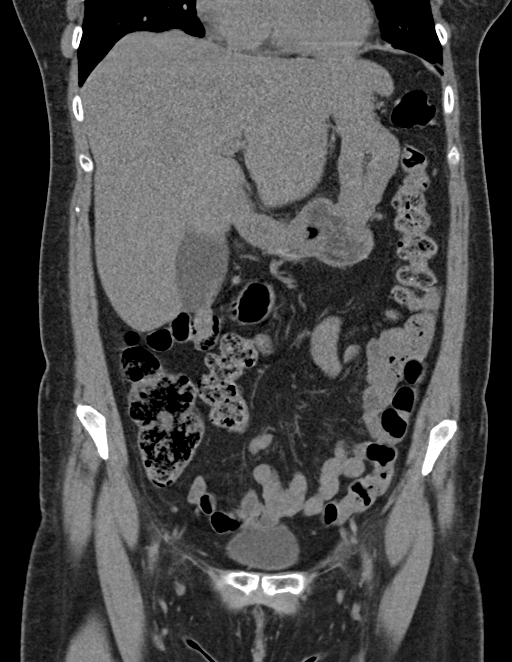
[im 61/136  soft-tissue]
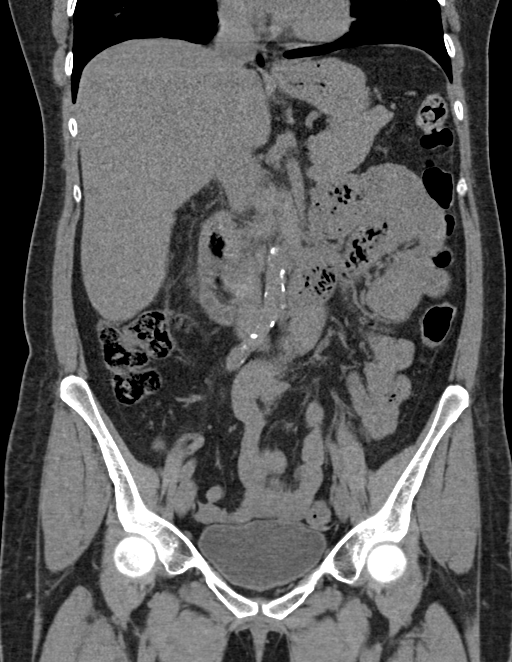
[im 76/136  soft-tissue]
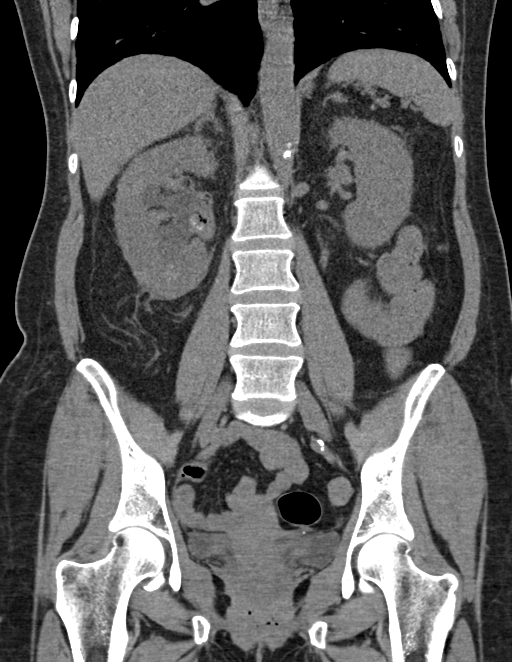

[16 of 46 positions shown; findings below may reference images not displayed]

FINDINGS: Lower chest: No acute abnormality.

Hepatobiliary: No focal liver abnormality. Elongated right lobe of
the liver may represent hepatomegaly or a Riedel's lobe.
Cholelithiasis. No biliary ductal dilatation.

Pancreas: Unremarkable. No pancreatic ductal dilatation or
surrounding inflammatory changes.

Spleen: Normal in size without focal abnormality.

Adrenals/Urinary Tract: Normal adrenal glands. Normal left kidney
and ureter. Normal bladder.

Moderate proximal right hydronephrosis and perinephric stranding.
Mixed attenuation debris including foci of calcification and air at
the ureteropelvic junction. There are few additional punctate
nonobstructing stones in the right kidney.

Stomach/Bowel: Stomach is within normal limits. Appendix appears
normal. No evidence of bowel wall thickening, distention, or
inflammatory changes.

Vascular/Lymphatic: No significant vascular findings are present. No
enlarged abdominal or pelvic lymph nodes.

Reproductive: Uterus and bilateral adnexa are unremarkable.

Other: No abdominal wall hernia or abnormality. No abdominopelvic
ascites.

Musculoskeletal: No acute or significant osseous findings.
IMPRESSION: 1. Moderate proximal right hydronephrosis and perinephric stranding.
Obstructing mixed attenuation debris including foci of calcification
and air at the ureteropelvic junction. Calcifications likely
represent fragmented stones. Foci of air may be related to recent
instrumentation versus infection, clinical correlation recommended.
2. Cholelithiasis.
3. Punctate right kidney nonobstructive nephrolithiasis.

By: Akeeno Dale M.D.

## 2020-03-10 ENCOUNTER — Encounter (HOSPITAL_COMMUNITY): Payer: Self-pay | Admitting: Family Medicine

## 2020-03-10 ENCOUNTER — Ambulatory Visit (HOSPITAL_COMMUNITY)
Admission: EM | Admit: 2020-03-10 | Discharge: 2020-03-10 | Disposition: A | Discharge status: Home / Self Care | Payer: BC Managed Care – PPO | Attending: Family Medicine | Admitting: Family Medicine

## 2020-03-10 ENCOUNTER — Ambulatory Visit (INDEPENDENT_AMBULATORY_CARE_PROVIDER_SITE_OTHER): Payer: BC Managed Care – PPO

## 2020-03-10 ENCOUNTER — Other Ambulatory Visit: Payer: Self-pay

## 2020-03-10 DIAGNOSIS — W19XXXA Unspecified fall, initial encounter: Secondary | ICD-10-CM | POA: Diagnosis not present

## 2020-03-10 DIAGNOSIS — S42291A Other displaced fracture of upper end of right humerus, initial encounter for closed fracture: Secondary | ICD-10-CM | POA: Diagnosis not present

## 2020-03-10 DIAGNOSIS — S42214A Unspecified nondisplaced fracture of surgical neck of right humerus, initial encounter for closed fracture: Secondary | ICD-10-CM

## 2020-03-10 HISTORY — DX: Renal tubulo-interstitial disease, unspecified: N15.9

## 2020-03-10 MED ORDER — OXYCODONE-ACETAMINOPHEN 5-325 MG PO TABS: 2.0000 | ORAL_TABLET | ORAL | 0 refills | Status: AC | PRN

## 2020-03-10 NOTE — ED Provider Notes (Addendum)
Rita Stewart    CSN: [id number] Arrival date & time: 03/10/20  1054      History   Chief Complaint Chief Complaint  Patient presents with  . Fall  . Arm Injury    HPI Rita Stewart is a 59 y.o. female.   Initial [facility] patient visit.  59 yo woman complains of right arm and wrist pain after fall.  Reports falling in parking lot @ 1000 while chasing shopping cart.  Denies any head injury.  C/O significant right upper arm pain with inability to move right upper arm due to pain; RUE CMS intact.  Abrasions noted to bilat knees.  Ambulates without difficulty.  Right handed.  Brought here by husband.     Past Medical History:  Diagnosis Date  . Anemia   . Diverticulosis    Noted on CT abd/pelvis  . History of colon polyps   . History of kidney stones   . Indigestion    occ  . Kidney infection   . Mixed hyperlipidemia   . Pre-diabetes   . Psoriasis    Mild    Patient Active Problem List   Diagnosis Date Noted  . Pyelonephritis of right kidney 09/08/2018    Past Surgical History:  Procedure Laterality Date  . COLONOSCOPY    . CYSTOSCOPY WITH STENT PLACEMENT Right 09/08/2018   Procedure: CYSTOSCOPY WITH STENT PLACEMENT;  Surgeon: Raynelle Bring, MD;  Location: [facility];  Service: Urology;  Laterality: Right;  . CYSTOSCOPY/URETEROSCOPY/HOLMIUM LASER/STENT PLACEMENT Right 09/27/2018   Procedure: CYSTOSCOPY/URETEROSCOPY/STONE BASKETRY/STENT PLACEMENT;  Surgeon: Raynelle Bring, MD;  Location: [facility];  Service: Urology;  Laterality: Right;  . DENTAL SURGERY     childhood  . EXTRACORPOREAL SHOCK WAVE LITHOTRIPSY    . SKIN GRAFT     childhood  . Stent in kidney      OB History   No obstetric history on file.      Home Medications    Prior to Admission medications   Medication Sig Start Date End Date Taking? Authorizing Provider  Choline Fenofibrate (FENOFIBRIC ACID) 135 MG CPDR Take 135 mg by mouth at bedtime.  08/16/18  Yes [provider]  rosuvastatin (CRESTOR) 20 MG tablet Take 20 mg by mouth at bedtime. 08/19/18  Yes [provider]  acetaminophen (TYLENOL) 325 MG tablet Take 650 mg by mouth every 6 (six) hours as needed for moderate pain or headache.    [provider]  ondansetron (ZOFRAN) 4 MG tablet Take 4 mg by mouth every 8 (eight) hours as needed for nausea or vomiting.    [provider]  oxybutynin (DITROPAN) 5 MG tablet Take 5 mg by mouth 3 (three) times daily as needed for bladder spasms.  08/27/18   [provider]  oxyCODONE-acetaminophen (PERCOCET/ROXICET) 5-325 MG tablet Take 2 tablets by mouth every 4 (four) hours as needed for severe pain. 03/10/20   Robyn Haber, MD  traMADol (ULTRAM) 50 MG tablet Take 1-2 tablets (50-100 mg total) by mouth every 6 (six) hours as needed (pain). Patient taking differently: Take 50-100 mg by mouth every 6 (six) hours as needed for moderate pain.  09/09/18   Raynelle Bring, MD    Family History Family History  Problem Relation Age of Onset  . Healthy Mother   . Macular degeneration Father     Social History Social History   Tobacco Use  . Smoking status: Current Every Day Smoker    Packs/day: 1.00    Years: 20.00  Pack years: 20.00    Types: Cigarettes  . Smokeless tobacco: Never Used  Substance Use Topics  . Alcohol use: Yes    Comment: rare  . Drug use: Never     Allergies   Ciprofloxacin and Nitrofurantoin   Review of Systems Review of Systems   Physical Exam Triage Vital Signs ED Triage Vitals  Enc Vitals Group     BP      Pulse      Resp      Temp      Temp src      SpO2      Weight      Height      Head Circumference      Peak Flow      Pain Score      Pain Loc      Pain Edu?      Excl. in GC?    No data found.  Updated Vital Signs BP (!) 167/95   Pulse (!) 109   Temp 98 F (36.7 C) (Oral)   Resp 20   SpO2 97%    Physical Exam Vitals and nursing note reviewed.  Constitutional:        Appearance: Normal appearance. She is normal weight.  Eyes:     Conjunctiva/sclera: Conjunctivae normal.  Cardiovascular:     Rate and Rhythm: Normal rate.  Pulmonary:     Effort: Pulmonary effort is normal.  Musculoskeletal:        General: Swelling, tenderness, deformity and signs of injury present.     Cervical back: Normal range of motion.     Comments: Able to move all fingers and wrist Nontender wrist and elbow Swollen right shoulder with anterior prominence suggesting dislocation.  Very tender upper humerus and unable to move it.  Skin:    General: Skin is warm and dry.  Neurological:     General: No focal deficit present.     Mental Status: She is alert and oriented to person, place, and time.  Psychiatric:        Mood and Affect: Mood normal.        Thought Content: Thought content normal.        Judgment: Judgment normal.      UC Treatments / Results  Labs (all labs ordered are listed, but only abnormal results are displayed) Labs Reviewed - No data to display  EKG   Radiology DG Humerus Right  Result Date: 03/10/2020 CLINICAL DATA:  Fall, pain EXAM: RIGHT HUMERUS - 2+ VIEW COMPARISON:  None. FINDINGS: Nondisplaced fractures of the proximal right humerus involving the surgical neck and likely the greater trochanter. The distal humerus is intact. The partially imaged right chest is unremarkable. IMPRESSION: Nondisplaced fractures of the proximal right humerus involving the surgical neck and likely the greater trochanter. Additional radiographic views and/or CT may be helpful to assess fracture anatomy. Electronically Signed   By: Lauralyn Primes M.D.   On: 03/10/2020 12:43    Procedures Procedures (including critical care time)  Medications Ordered in UC Medications - No data to display  Initial Impression / Assessment and Plan / UC Course  I have reviewed the triage vital signs and the nursing notes.  Pertinent labs & imaging results that were available  during my care of the patient were reviewed by me and considered in my medical decision making (see chart for details).    Final Clinical Impressions(s) / UC Diagnoses   Final diagnoses:  Fracture of humeral  head, closed, right, initial encounter     Discharge Instructions     Apply ice to the shoulder every hour while awake today  Call Dr. Carola Frost on Monday for appointment    ED Prescriptions    Medication Sig Dispense Auth. Provider   oxyCODONE-acetaminophen (PERCOCET/ROXICET) 5-325 MG tablet Take 2 tablets by mouth every 4 (four) hours as needed for severe pain. 15 tablet Elvina Sidle, MD     I have reviewed the PDMP during this encounter.   Elvina Sidle, MD 03/10/20 1238    Elvina Sidle, MD 03/10/20 1346

## 2020-03-10 NOTE — Discharge Instructions (Addendum)
Apply ice to the shoulder every hour while awake today  Call Dr. Carola Frost on Monday for appointment

## 2020-03-10 NOTE — ED Triage Notes (Signed)
Reports falling in parking lot @ 1000 while chasing shopping cart.  Denies any head injury.  C/O significant right upper arm pain with inability to move right upper arm due to pain; RUE CMS intact.  Abrasions noted to bilat knees.  Ambulates without difficulty.

## 2020-03-22 ENCOUNTER — Other Ambulatory Visit: Payer: Self-pay | Admitting: Orthopedic Surgery

## 2020-03-22 DIAGNOSIS — S42294A Other nondisplaced fracture of upper end of right humerus, initial encounter for closed fracture: Secondary | ICD-10-CM

## 2020-03-27 ENCOUNTER — Other Ambulatory Visit: Payer: Self-pay | Admitting: Orthopedic Surgery

## 2020-03-27 DIAGNOSIS — S42294A Other nondisplaced fracture of upper end of right humerus, initial encounter for closed fracture: Secondary | ICD-10-CM

## 2020-03-29 ENCOUNTER — Ambulatory Visit
Admission: RE | Admit: 2020-03-29 | Discharge: 2020-03-29 | Disposition: A | Discharge status: Home / Self Care | Payer: BC Managed Care – PPO | Source: Ambulatory Visit | Attending: Orthopedic Surgery | Admitting: Orthopedic Surgery

## 2020-03-29 DIAGNOSIS — S42294A Other nondisplaced fracture of upper end of right humerus, initial encounter for closed fracture: Secondary | ICD-10-CM

## 2024-02-02 NOTE — Progress Notes (Signed)
 Assessment:   1. Sore throat   2. Painful swallowing   3. Nasal congestion       Plan:   1. Labs and medications ordered today:  Patient's Medications  New Prescriptions   LIDOCAINE  (XYLOCAINE ) 2% SOLUTION    Take 5 mLs by mouth as needed for Pain for up to 10 days.     Current Outpatient Medications  Medication Instructions  . acetaminophen  (TYLENOL ) 500 mg, Oral, Every 6 hours as needed  . ammonium lactate (LAC-HYDRIN,AMLACTIN) 12 % lotion As needed  . benzoyl peroxide (PANOXYL CREAMY WASH) 4% external liquid As needed  . cetirizine (ZYRTEC,ALL DAY ALLERGY) 10 mg, Oral, Daily  . diphenhydrAMINE  (BANOPHEN ,BENADRYL ) 25 mg capsule 1 capsule, Oral, At bedtime  . ergocalciferol (VITAMIN D2) 50,000 units CAPS capsule TAKE 1 CAPSULE BY MOUTH WEEKLY FOR 12 DOSES  . fenofibrate (TRIGLIDE) 160 mg, Oral, Daily  . fluticasone propionate (FLONASE) 50 mcg/actuation nasal spray 2 sprays, Both Nostrils, Daily as needed  . Ibuprofen 200 MG CAPS No dose, route, or frequency recorded.  . lidocaine  (XYLOCAINE ) 2% solution 5 mLs, Oral, As needed  . lisinopril-hydrochlorothiazide (PRINZIDE,ZESTORETIC) 10-12.5 MG per tablet TAKE 1 TABLET BY MOUTH EVERY DAY  . Multiple Vitamin (MULTIVITAMIN ADULT) TABS 1 tablet, Oral, Daily  . rosuvastatin  calcium  (CRESTOR ) 20 mg tablet TAKE 1 TABLET BY MOUTH EVERYDAY AT BEDTIME    Rapid strep test negative. Results discussed with pt. Exam and hx consistent with viral infection.  Pt likely with viral syndrome .  Symptoms usually can last 4-7 days before they improve. Coughs in children last 2 weeks.    No indication for oral antibiotics at this time. Antibiotics have side effects like diarrhea. They can't fight viruses,and taking them now may hurt their chances of fighting bacterial infections in the future.    Instructions all given as below  You should respond well to continued supportive and symptomatic care. Here is a list of some medicines you can try that  are over-the-counter for different symptoms:  Fever/aches/chills/fatigue: May take Tylenol  (Acetaminophen ) or an NSAID such as Advil/Motrin (Ibuprofen) or Aleve (Naproxen) as directed according to the medication directions. You may alternate these every 4 hours as needed, Do not take Tylenol  if you have any history of liver problems or have otherwise been told not to take it from another provider. Similarly, do not take NSAIDs if you have any history of chronic kidney disease, heart problems, recent stomach ulcers/bleeding, are pregnant, or have otherwise been advised not to take them by another provider.     Congestion: Sudafed (Pseudoephedrine) will help to constrict the blood vessels in your nose and reduce congestion. Do not take Sudafed if you have high blood pressure because Sudafed will elevate it. If you cannot take Sudafed, you can try Coricidin-HBP instead. You can also try Flonase nasal spray (Fluticasone) which will help to decrease mucus production and decrease the congestion in your sinuses. Saline nasal rinses can also help and are safe to use as much as you like.  Cough: For a dry, hacky cough, try Robitussin or Delsym (Dextromethorphan). If you have asthma or COPD, you should not take this. If your cough is wet or you feel like there is mucus trapped in your chest, Mucinex (Guaifenesin) is the best medicine to help break up that mucus and make it less viscous, helping you to expel it.  Sore throat: The best remedies for a sore throat are home remedies including lozenges, sore throat sprays, honey, hydration. Tylenol /ibuprofen can  help.    MEDICAL DECISION MAKING  PROBLEMS ADDRESSED: Uncomplicated illness/injury or stable chronic illness = low, level 3  Patient presenting with sore throat consistent with viral pharyngitis. Rapid strep was obtained and was negative.  No history of immunocompromise. Nontoxic appearance. Patient euvolemic with no trismus. No airway compromise. No change in  voice, exudates, enlarged lymph nodes. Able to tolerate PO. Given History and Exam I have low suspicion for this presentation being caused by PTA, RPA, Ludwigs angina, Epiglottitis or Bacterial Tracheitis, EBV, acute HIV, or Strep throat. Patient advised of supportive care and instructed to follow-up with PCP.   Encounter Diagnoses  Name Primary?  . Sore throat Yes  . Painful swallowing   . Nasal congestion      The diagnosis was discussed in detail with Rita Stewart. We discussed the treatment options to include the risks and benefits. Rita Stewart is in agreement with the management plan and all questions were answered..   COMPLEXITY OF DATA: 1 lab test. Level 2  Orders placed during the encounter:  Recent Results (from the past 24 hour(s))  POCT Strep Nucleic Acid   Collection Time: 02/02/24  9:20 AM  Result Value Ref Range   Strep Negative Negative     No results found.   RISK OF PATIENT MANAGEMENT: OTC meds or non-urgent referral- Level 3  Patient's Medications  New Prescriptions   LIDOCAINE  (XYLOCAINE ) 2% SOLUTION    Take 5 mLs by mouth as needed for Pain for up to 10 days.      Follow up instructions  Rita Stewart will continue all current prescription medication for chronic conditions. We also discussed OTC medications to be used to include OTC Tylenol  PRN for pain and/or fever, OTC NSAIDS PRN for pain and /or fever, OTC saline nasal spray, decongestant and/or antihistamine, Refer to PCP. It is extremely important, and Rita Stewart understands the importance, if symptoms do not improve or worsen in the next 2 days or if new symptoms arise, Rita Stewart will follow up with their PCP, our clinic, or go to the ED if worse. Rita Stewart can call with any concerns or questions.   TENJERLA, SRISAI SARATDYUTI, PA-C    Subjective:  Sore Throat: Rita Stewart is a 63 y.o. female who complains of a sore throat. Symptoms began a few days ago. Pain is of moderate severity. No fever. Other associated symptoms  have included chills and a headache.  Fluid intake is good.  There has not been contact with an individual with known strep.  Current medications include ibuprofen. No CP, SOB, or dizziness.  Current Outpatient Medications  Medication Instructions  . acetaminophen  (TYLENOL ) 500 mg, Oral, Every 6 hours as needed  . ammonium lactate (LAC-HYDRIN,AMLACTIN) 12 % lotion As needed  . benzoyl peroxide (PANOXYL CREAMY WASH) 4% external liquid As needed  . cetirizine (ZYRTEC,ALL DAY ALLERGY) 10 mg, Oral, Daily  . diphenhydrAMINE  (BANOPHEN ,BENADRYL ) 25 mg capsule 1 capsule, Oral, At bedtime  . ergocalciferol (VITAMIN D2) 50,000 units CAPS capsule TAKE 1 CAPSULE BY MOUTH WEEKLY FOR 12 DOSES  . fenofibrate (TRIGLIDE) 160 mg, Oral, Daily  . fluticasone propionate (FLONASE) 50 mcg/actuation nasal spray 2 sprays, Both Nostrils, Daily as needed  . Ibuprofen 200 MG CAPS No dose, route, or frequency recorded.  . lidocaine  (XYLOCAINE ) 2% solution 5 mLs, Oral, As needed  . lisinopril-hydrochlorothiazide (PRINZIDE,ZESTORETIC) 10-12.5 MG per tablet TAKE 1 TABLET BY MOUTH EVERY DAY  . Multiple Vitamin (MULTIVITAMIN ADULT) TABS 1 tablet, Oral, Daily  . rosuvastatin  calcium  (CRESTOR ) 20 mg  tablet TAKE 1 TABLET BY MOUTH EVERYDAY AT BEDTIME     Allergies  Allergen Reactions  . Skin Adhesives Unknown  . Ciprofloxacin Nausea Only  . Nitrofurantoin Diarrhea and Abdominal Pain      Objective:  BP 131/86   Pulse 97   Temp 97.8 F (36.6 C) (Tympanic)   Resp 16   Ht 5' 7 (1.702 m)   Wt 196 lb (88.9 kg)   LMP 07/04/2015 (LMP Unknown)   SpO2 97%   BMI 30.70 kg/m   General appearance: alert, cooperative and no distress Throat: red, inflamed, no tonsillar edema or exudates; no PTA; no uvular deviation Neck: no adenopathy, supple, symmetrical, trachea midline and thyroid not enlarged, symmetric Lungs: clear to auscultation bilaterally Heart: regular rate and rhythm, S1, S2 normal, no murmur, click, rub or  gallop Skin: Skin color, texture, turgor normal. No rashes or lesions Extremities: extremities normal, atraumatic, no cyanosis or edema  Recent Results (from the past 14 hour(s))  POCT Strep Nucleic Acid   Collection Time: 02/02/24  9:20 AM  Result Value Ref Range   Strep Negative Negative

## 2024-08-02 ENCOUNTER — Encounter: Payer: Self-pay | Admitting: Dietician

## 2024-08-02 NOTE — Progress Notes (Signed)
 Patient participated in the Nutrition and Cancer webinar as caregiver for spouse. AICR guidelines were reviewed.  [facility] nutrition resources for survivors were encouraged (FYNN, cooking classes, individual MNT with outpatient RDs in weight management and DM management).  Contact information provided.  Micheline Craven, RDN, LDN Registered Dietitian, [facility] Part Time Remote (Usual office hours: Tuesday-Thursday) Mobile: [phone] Remote Office: [phone]
